# Patient Record
Sex: Female | Born: 1960 | Race: White | Hispanic: No | Marital: Married | Smoking: Never smoker
Health system: Southern US, Community
[De-identification: ages and names within clinical notes are randomized; demographics above are authoritative.]

## PROBLEM LIST (undated history)

## (undated) DIAGNOSIS — Z9889 Other specified postprocedural states: Secondary | ICD-10-CM

## (undated) DIAGNOSIS — K589 Irritable bowel syndrome without diarrhea: Secondary | ICD-10-CM

## (undated) DIAGNOSIS — K8020 Calculus of gallbladder without cholecystitis without obstruction: Secondary | ICD-10-CM

## (undated) DIAGNOSIS — R519 Headache, unspecified: Secondary | ICD-10-CM

## (undated) DIAGNOSIS — E785 Hyperlipidemia, unspecified: Secondary | ICD-10-CM

## (undated) DIAGNOSIS — I10 Essential (primary) hypertension: Secondary | ICD-10-CM

## (undated) DIAGNOSIS — R51 Headache: Secondary | ICD-10-CM

## (undated) DIAGNOSIS — R112 Nausea with vomiting, unspecified: Secondary | ICD-10-CM

## (undated) DIAGNOSIS — Z87442 Personal history of urinary calculi: Secondary | ICD-10-CM

## (undated) HISTORY — DX: Essential (primary) hypertension: I10

## (undated) HISTORY — PX: OTHER SURGICAL HISTORY: SHX169

## (undated) HISTORY — DX: Hyperlipidemia, unspecified: E78.5

---

## 1997-07-04 ENCOUNTER — Other Ambulatory Visit: Admission: RE | Admit: 1997-07-04 | Discharge: 1997-07-04 | Payer: Self-pay | Admitting: Obstetrics & Gynecology

## 1998-08-27 ENCOUNTER — Other Ambulatory Visit: Admission: RE | Admit: 1998-08-27 | Discharge: 1998-08-27 | Payer: Self-pay | Admitting: Obstetrics & Gynecology

## 1999-01-28 ENCOUNTER — Inpatient Hospital Stay (HOSPITAL_COMMUNITY): Admission: AD | Admit: 1999-01-28 | Discharge: 1999-01-28 | Payer: Self-pay | Admitting: Obstetrics and Gynecology

## 1999-05-02 ENCOUNTER — Inpatient Hospital Stay (HOSPITAL_COMMUNITY): Admission: AD | Admit: 1999-05-02 | Discharge: 1999-05-05 | Payer: Self-pay | Admitting: Obstetrics & Gynecology

## 2000-07-01 ENCOUNTER — Other Ambulatory Visit: Admission: RE | Admit: 2000-07-01 | Discharge: 2000-07-01 | Payer: Self-pay | Admitting: Obstetrics & Gynecology

## 2001-07-22 ENCOUNTER — Other Ambulatory Visit: Admission: RE | Admit: 2001-07-22 | Discharge: 2001-07-22 | Payer: Self-pay | Admitting: Obstetrics & Gynecology

## 2002-02-25 ENCOUNTER — Encounter: Payer: Self-pay | Admitting: Emergency Medicine

## 2002-02-25 ENCOUNTER — Emergency Department (HOSPITAL_COMMUNITY): Admission: AC | Admit: 2002-02-25 | Discharge: 2002-02-25 | Payer: Self-pay

## 2002-03-15 ENCOUNTER — Encounter: Admission: RE | Admit: 2002-03-15 | Discharge: 2002-03-15 | Payer: Self-pay | Admitting: Family Medicine

## 2002-03-15 ENCOUNTER — Encounter: Payer: Self-pay | Admitting: Family Medicine

## 2002-03-28 ENCOUNTER — Encounter: Payer: Self-pay | Admitting: Family Medicine

## 2002-03-28 ENCOUNTER — Encounter: Admission: RE | Admit: 2002-03-28 | Discharge: 2002-03-28 | Payer: Self-pay | Admitting: Family Medicine

## 2002-09-22 ENCOUNTER — Other Ambulatory Visit: Admission: RE | Admit: 2002-09-22 | Discharge: 2002-09-22 | Payer: Self-pay | Admitting: Obstetrics & Gynecology

## 2003-10-04 ENCOUNTER — Other Ambulatory Visit: Admission: RE | Admit: 2003-10-04 | Discharge: 2003-10-04 | Payer: Self-pay | Admitting: Obstetrics & Gynecology

## 2003-11-13 ENCOUNTER — Ambulatory Visit (HOSPITAL_COMMUNITY): Admission: RE | Admit: 2003-11-13 | Discharge: 2003-11-13 | Payer: Self-pay | Admitting: Family Medicine

## 2004-11-21 ENCOUNTER — Other Ambulatory Visit: Admission: RE | Admit: 2004-11-21 | Discharge: 2004-11-21 | Payer: Self-pay | Admitting: Obstetrics & Gynecology

## 2007-06-01 ENCOUNTER — Encounter: Admission: RE | Admit: 2007-06-01 | Discharge: 2007-06-01 | Payer: Self-pay | Admitting: Obstetrics & Gynecology

## 2010-08-02 NOTE — Op Note (Signed)
[facility]  Patient:    Cassandra Jacobson, Cassandra Jacobson                       MRN: [id number] Proc. Date: 05/02/99 Adm. Date:  [id number] Attending:  Minette Headland                           Operative Report  PREOPERATIVE DIAGNOSIS:           1. Intrauterine pregnancy at term.                                   2. Surgically scarred uterus.                                   3. Surgical absence of left ovary.  POSTOPERATIVE DIAGNOSIS:          1. Intrauterine pregnancy at term.                                   2. Surgically scarred uterus.                                   3. Surgical absence of left ovary.                                   4. Delivery of viable female infant, Apgars of                                      8/9, cord pH 7.30.  OPERATION:                        Repeat low transverse cervical cesarean section.  SURGEON:                          Freddy Finner, M.D.  ASSISTANT:                        Juluis Mire, M.D.  ESTIMATED INTRAOPERATIVE BLOOD LOSS:  600 cc.  ANESTHESIA:                       Spinal.  INTRAOPERATIVE COMPLICATIONS:     None.  DETAILS OF PRESENT ILLNESS:       According to the admission note.  DESCRIPTION OF PROCEDURE:         The patient was admitted on the morning of surgery, brought to the operating room where she was placed under adequate spinal anesthesia, placed in dorsal recumbent position.  Right hip was elevated 15 degrees.  The abdomen was prepped and draped in the usual fashion, and Foley catheter was placed using sterile technique.  A low abdominal transverse incision was made partially through the old scar but not completely because of the irregularity and the placement of the old scar.  The incision was carried sharply down to the fascia.  The fascia was entered sharply and extended to extend the skin incision.  Rectus sheath was developed superiorly and inferiorly with blunt and sharp  dissection.  Bleeding subcutaneous and subfascial sites were controlled with Bovie.  The rectus muscles were divided in the midline.  The bladder was found to be far advanced on the lower segment, and careful dissection was used to extend the peritoneal opening.  Bladder blade was placed.  The bladder was dissected carefully off the lower segment.  Transverse  incision was made in the lower segment and extended bluntly in the transverse direction.  A viable female infant was then delivered without difficulty Apgars are noted above.  Fluid was clear.  Placenta and other products of conception were debrided from the uterus after collecting routine venous and anterior cord blood. The edges of the incision were grasped with ______ forceps.  Incision was closed in a single layer with running interlocking with 0 Monocryl.  Interrupted figure-of-eight of 0 Monocryl was required for complete hemostasis.  Small bleeding sources were controlled with the Bovie.  Irrigation was carried out.  The uterus was palpably and visibly normal.  Right tube and ovary were normal.  Left tube as normal.  Left ovary was absent.   After irrigation and appropriate counts, the abdominal incision was closed in layers.  Peritoneum and rectus muscles were closed in a single layer with 0 Monocryl.  Fascia was closed with running 0 PDS.  Skin was closed with large skin staples and 1/4 inch Steri-Strips.  The patient tolerated the operative procedure well and was taken to recovery in good condition. DD:  05/02/99 TD:  05/02/99 Job: [id number]

## 2010-08-02 NOTE — H&P (Signed)
[facility] of [facility]  Patient:    Cassandra Jacobson, Cassandra Jacobson                       MRN: [id number] Adm. Date:  [id number] Attending:  Minette Headland                         History and Physical  ADMISSION DIAGNOSES:  Intrauterine pregnancy at term.  Surgically scarred uterus.  HISTORY:  The patient is a 50 year old married white female, gravida 3, para 1, who was delivered by cesarean section for CPD with her first delivery in 1998.  She has had an essentially uneventful pregnancy and is admitted now for repeat cesarean  delivery.  REVIEW OF SYSTEMS:  Negative.  PAST MEDICAL HISTORY:  Recorded in the prenatal summary and will not be repeated.  PHYSICAL EXAMINATION:  HEENT:  Grossly within normal limits.  VITAL SIGNS:  Blood pressure is 120/78.  CHEST:  Clear to auscultation.  HEART:  Normal sinus rhythm without murmurs, gallops or rubs.  ABDOMEN:  Gravid.  Estimated fetal size of 7-1/2 pounds.  EXTREMITIES:  +1 edema without cyanosis or clubbing.  CERVIX:  2 dilated, 50% effaced.  ASSESSMENT:  Intrauterine pregnancy at term, surgically scarred uterus, first cesarean for CPD.  PLAN:  Repeat low transverse cesarean section. DD:  05/02/99 TD:  05/02/99 Job: [id number]

## 2010-08-02 NOTE — Discharge Summary (Signed)
[facility]  Patient:    Cassandra Jacobson, Cassandra Jacobson                       MRN: [id number] Adm. Date:  [id number] Disc. Date: [id number] Attending:  Minette Headland Dictator:   Danie Chandler, R.N.                           Discharge Summary  ADMISSION DIAGNOSES:          1. Intrauterine pregnancy at term.                               2. Surgically scarred uterus.                               3. Surgical absence of left ovary.  DISCHARGE DIAGNOSES:          1. Intrauterine pregnancy at term.                               2. Surgically scarred uterus.                               3. Surgical absence of left ovary.  PROCEDURE:                    On May 02, 1999, repeat low transverse cesarean section.  REASON FOR ADMISSION:         The patient is a 50 year old married white female, gravida 3, para 1, who was delivered by cesarean section for CPD with first delivery in 1998.  The patient has had an uneventful pregnancy and is now admitted for repeat cesarean delivery.  HOSPITAL COURSE:              The patient is taken to the operating room and undergoes the above named procedure without complications.  This is productive f a viable female infant with Apgars of 8 at one minute and 9 at five minutes and an arterial cord pH of 7.30.  Postoperatively, the patient does well.  On postoperative day #1, the patient had good control of pain.  Her hemoglobin was  10.3, hematocrit 30.1, and white blood cell count 8.5.  On postoperative day #2, the patient was tolerating a regular diet, had a good return of bowel function.  She also was ambulating well without difficulty.  She was discharged home on postoperative day #3.  CONDITION ON DISCHARGE:       Good.  DIET:                         Regular as tolerated.  ACTIVITY:                     No heavy lifting, no driving, and no vaginal entry.  FOLLOW-UP:                    She is to follow up in the  office in one to two weeks for incision check.  She is to call for temperature greater than 100 degrees, persistent nausea and vomiting, heavy vaginal bleeding, and/or redness or drainage from  the incision site.  DISCHARGE MEDICATIONS:        1. Prenatal vitamins one p.o. q.d.                               2. Tylox #40 one to two p.o. q.4h. p.r.n. pain.                               3. Motrin 600 mg #30 one every six hours as needed                                  for pain. DD:  05/15/99 TD:  05/15/99 Job: [id number]

## 2013-09-12 ENCOUNTER — Encounter: Payer: Self-pay | Admitting: Cardiovascular Disease

## 2013-09-12 ENCOUNTER — Ambulatory Visit (INDEPENDENT_AMBULATORY_CARE_PROVIDER_SITE_OTHER): Payer: BC Managed Care – PPO | Admitting: Cardiovascular Disease

## 2013-09-12 VITALS — BP 152/110 | HR 86 | Ht 62.0 in | Wt 153.5 lb

## 2013-09-12 DIAGNOSIS — E785 Hyperlipidemia, unspecified: Secondary | ICD-10-CM

## 2013-09-12 DIAGNOSIS — I10 Essential (primary) hypertension: Secondary | ICD-10-CM | POA: Insufficient documentation

## 2013-09-12 DIAGNOSIS — Z79899 Other long term (current) drug therapy: Secondary | ICD-10-CM

## 2013-09-12 MED ORDER — LISINOPRIL 5 MG PO TABS: 5.0000 mg | ORAL_TABLET | Freq: Every day | ORAL | Status: DC

## 2013-09-12 NOTE — Assessment & Plan Note (Signed)
Apparently her LDL is elevated at recent blood blood work performed by her primary care physician. I will obtain a copy and review and make further recommendations

## 2013-09-12 NOTE — Patient Instructions (Signed)
  We will see you back in follow up in 1 year with Dr Allyson SabalBerry.   We will see you in 1 month with our pharmacist, Belenda CruiseKristin, for blood pressure management.   Dr Allyson SabalBerry has ordered : 1. Start Lisinopril 5 mg daily  2. Have blood work done in 2 weeks.

## 2013-09-12 NOTE — Assessment & Plan Note (Signed)
Her blood pressure have been running in the 140-150 range over the mid 90s. She does not watch her salt intake. She's under a lot of stress at home because of personal and family issues. I'm going to begin her on lisinopril 5 mg a day. We'll check electrolytes and renal function 2 weeks. She'll see Belenda CruiseKristin back in the office for blood pressure followup.

## 2013-09-12 NOTE — Progress Notes (Signed)
09/12/2013 Cassandra Jacobson   28-Feb-1961  [id number]  Primary Physician Herb GraysSPEAR, TAMMY, MD Primary Cardiologist: Runell GessJonathan J. Margia Wiesen MD Roseanne RenoFACP,FACC,FAHA, FSCAI   HPI:  Ms. 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 She was referred by Dr. Marcos EkeJimmy Smith for cardiovascular evaluation because of positive effectors and hypertension. She was seen here 6 years ago by Dr. Charlett BlakeEischorn  for an abnormal EKG consistent with mild left ventricular hypertrophy. A 2-D echo was performed at that time which was entirely normal specifically, there is no evidence of ventricular hypertrophy. She has noted progressive factors other than mild elevation of her LDL by her primary care physician. She is under a lot of stress at home with family related issues and has been following her blood pressure at home which has been moderately elevated.  Current Outpatient Prescriptions  Medication Sig Dispense Refill  . DULoxetine (CYMBALTA) 30 MG capsule Take 30 mg by mouth daily.      . fluticasone (VERAMYST) 27.5 MCG/SPRAY nasal spray Place 2 sprays into the nose as needed for rhinitis.      Marland Kitchen. LORazepam (ATIVAN) 1 MG tablet Take 1 mg by mouth as needed for anxiety.      . norgestimate-ethinyl estradiol (ORTHO-CYCLEN,SPRINTEC,PREVIFEM) 0.25-35 MG-MCG tablet Take 1 tablet by mouth daily.       No current facility-administered medications for this visit.    No Known Allergies  History   Social History  . Marital Status: Married    Spouse Name: N/A    Number of Children: N/A  . Years of Education: N/A   Occupational History  . Not on file.   Social History Main Topics  . Smoking status: Never Smoker   . Smokeless tobacco: Not on file  . Alcohol Use: Not on file  . Drug Use: Not on file  . Sexual Activity: Not on file   Other Topics Concern  . Not on file   Social History Narrative  . No  narrative on file     Review of Systems: General: negative for chills, fever, night sweats or weight changes.  Cardiovascular: negative for chest pain, dyspnea on exertion, edema, orthopnea, palpitations, paroxysmal nocturnal dyspnea or shortness of breath Dermatological: negative for rash Respiratory: negative for cough or wheezing Urologic: negative for hematuria Abdominal: negative for nausea, vomiting, diarrhea, bright red blood per rectum, melena, or hematemesis Neurologic: negative for visual changes, syncope, or dizziness All other systems reviewed and are otherwise negative except as noted above.    Blood pressure 152/110, pulse 86, height 5\' 2"  (1.575 m), weight 153 lb 8 oz (69.627 kg).  General appearance: alert and no distress Neck: no adenopathy, no carotid bruit, no JVD, supple, symmetrical, trachea midline and thyroid not enlarged, symmetric, no tenderness/mass/nodules Lungs: clear to auscultation bilaterally Heart: regular rate and rhythm, S1, S2 normal, no murmur, click, rub or gallop Extremities: extremities normal, atraumatic, no cyanosis or edema  EKG normal sinus rhythm at 86 without ST or T wave changes  ASSESSMENT AND PLAN:   Essential hypertension Her blood pressure have been running in the 140-150 range over the mid 90s. She does not watch her salt intake. She's under a lot of stress at home because of personal and family issues. I'm going to begin her on lisinopril 5 mg a day. We'll check electrolytes and renal function 2 weeks. She'll see Belenda CruiseKristin back in the office for  blood pressure followup.  Hyperlipidemia Apparently her LDL is elevated at recent blood blood work performed by her primary care physician. I will obtain a copy and review and make further recommendations      Runell GessJonathan J. Marzetta Lanza MD [facility] Specialty HospitalFACP,FACC,FAHA, [facility]FSCAI 09/12/2013 4:36 PM

## 2013-09-28 LAB — BASIC METABOLIC PANEL
BUN: 10 mg/dL (ref 6–23)
CHLORIDE: 104 meq/L (ref 96–112)
CO2: 26 mEq/L (ref 19–32)
CREATININE: 0.81 mg/dL (ref 0.50–1.10)
Calcium: 8.9 mg/dL (ref 8.4–10.5)
Glucose, Bld: 122 mg/dL — ABNORMAL HIGH (ref 70–99)
Potassium: 4.7 mEq/L (ref 3.5–5.3)
Sodium: 139 mEq/L (ref 135–145)

## 2013-09-29 ENCOUNTER — Encounter: Payer: Self-pay | Admitting: *Deleted

## 2013-10-12 ENCOUNTER — Ambulatory Visit (INDEPENDENT_AMBULATORY_CARE_PROVIDER_SITE_OTHER): Payer: BC Managed Care – PPO | Admitting: Pharmacist Clinician (PhC)/ Clinical Pharmacy Specialist

## 2013-10-12 ENCOUNTER — Encounter: Payer: Self-pay | Admitting: Pharmacist Clinician (PhC)/ Clinical Pharmacy Specialist

## 2013-10-12 VITALS — BP 134/90 | HR 76 | Ht 62.0 in | Wt 152.2 lb

## 2013-10-12 DIAGNOSIS — I10 Essential (primary) hypertension: Secondary | ICD-10-CM

## 2013-10-12 NOTE — Patient Instructions (Signed)
Your BP today is 134/90  (goal <140/90)  Check your blood pressure at home 3-4 times per week (if able) and keep record of the readings.  Take your BP meds as follows: continue with lisinopril 5mg  daily  Bring all of your meds, your BP cuff and your record of home blood pressures to your next appointment.  Exercise as you're able, try to walk approximately 30 minutes per day.  Keep salt intake to a minimum, especially watch canned and prepared boxed foods.  Eat more fresh fruits and vegetables and fewer canned items.  Avoid eating in fast food restaurants.    HOW TO TAKE YOUR BLOOD PRESSURE:   Rest 5 minutes before taking your blood pressure.    Don't smoke or drink caffeinated beverages for at least 30 minutes before.   Take your blood pressure before (not after) you eat.   Sit comfortably with your back supported and both feet on the floor (don't cross your legs).   Elevate your arm to heart level on a table or a desk.   Use the proper sized cuff. It should fit smoothly and snugly around your bare upper arm. There should be enough room to slip a fingertip under the cuff. The bottom edge of the cuff should be 1 inch above the crease of the elbow.   Ideally, take 3 measurements at one sitting and record the average.

## 2013-10-12 NOTE — Progress Notes (Signed)
     10/12/2013 Cassandra Jacobson 11/10/1960 [id number]   HPI:  Cassandra Jacobson is a 53 y.o. female patient of Dr Allyson SabalBerry, with a PMH below who presents today for a blood pressure check.  Cassandra Jacobson saw Dr. Allyson SabalBerry last month due to elevated blood pressure and was started on lisinopril 5mg .  She has had no problems with this, and states her home BP readings started to drop within days of starting the medication.  She did not bring her cuff with her today, but states most home readings are 120-130/70-90.  She is a little concerned about her heart rate, usually in the 80s.   She has had some stressful family issues in the past 10-12 months, including closing the estates of both her father and grandmother, as well as tending to her aging mother.  She has 2 teenage daughters and continues to work full time.  She does not use any tobacco products or drink alcohol.  She usually has just one cup of coffee at work each day.  She is careful to avoid high sodium foods and does not cook with salt or add extra at the table.  She hasn't done any regular exercise in the past year with everything else going on in her life.  Her family history is significant in that 3 of her grandparents had heart disease, as does her mother.  She is an only child.   Current Outpatient Prescriptions  Medication Sig Dispense Refill  . DULoxetine (CYMBALTA) 30 MG capsule Take 30 mg by mouth daily.      . fluticasone (VERAMYST) 27.5 MCG/SPRAY nasal spray Place 2 sprays into the nose as needed for rhinitis.      Marland Kitchen. lisinopril (PRINIVIL,ZESTRIL) 5 MG tablet Take 1 tablet (5 mg total) by mouth daily.  30 tablet  11  . norgestimate-ethinyl estradiol (ORTHO-CYCLEN,SPRINTEC,PREVIFEM) 0.25-35 MG-MCG tablet Take 1 tablet by mouth daily.       No current facility-administered medications for this visit.    No Known Allergies  Past Medical History  Diagnosis Date  . Hypertension   . Hyperlipidemia     Blood pressure 134/90, pulse 76, height 5'  2" (1.575 m), weight 152 lb 3.2 oz (69.037 kg). Standing 122/90  R arm 128/86   ASSESSMENT AND PLAN:  Today her BP is well controlled at 134/90.  She is not having any problems with her lisinopril.  She took her home BP cuff to her family MD several months ago and it was found to be accurate.  Of note she is using oral contraceptives to help with pre-menopausal symptoms, but states her gynecologist is planning to wean her off of them before the year is over.  We discussed the fact that estrogen can increase BP, so she knows to watch for any further decreases in her BP after the contraceptives are discontinued.  For now I have asked her to continue the lisinopril each day and also continue with home BP checks several times per week.  If she notices the readings increasing above her goal (<140/90), she can call the office for instructions.    Cassandra Jacobson PharmD CPP [facility]

## 2014-01-12 ENCOUNTER — Encounter (HOSPITAL_BASED_OUTPATIENT_CLINIC_OR_DEPARTMENT_OTHER): Payer: Self-pay | Admitting: Emergency Medicine

## 2014-01-12 ENCOUNTER — Emergency Department (HOSPITAL_BASED_OUTPATIENT_CLINIC_OR_DEPARTMENT_OTHER)
Admission: EM | Admit: 2014-01-12 | Discharge: 2014-01-12 | Disposition: A | Discharge status: Home / Self Care | Payer: BC Managed Care – PPO | Attending: Emergency Medicine | Admitting: Emergency Medicine

## 2014-01-12 ENCOUNTER — Emergency Department (HOSPITAL_BASED_OUTPATIENT_CLINIC_OR_DEPARTMENT_OTHER): Payer: BC Managed Care – PPO

## 2014-01-12 DIAGNOSIS — R1013 Epigastric pain: Secondary | ICD-10-CM | POA: Diagnosis present

## 2014-01-12 DIAGNOSIS — I10 Essential (primary) hypertension: Secondary | ICD-10-CM | POA: Insufficient documentation

## 2014-01-12 DIAGNOSIS — K8000 Calculus of gallbladder with acute cholecystitis without obstruction: Secondary | ICD-10-CM | POA: Diagnosis not present

## 2014-01-12 DIAGNOSIS — Z79899 Other long term (current) drug therapy: Secondary | ICD-10-CM | POA: Diagnosis not present

## 2014-01-12 DIAGNOSIS — Z8639 Personal history of other endocrine, nutritional and metabolic disease: Secondary | ICD-10-CM | POA: Diagnosis not present

## 2014-01-12 HISTORY — DX: Irritable bowel syndrome, unspecified: K58.9

## 2014-01-12 LAB — COMPREHENSIVE METABOLIC PANEL
ALK PHOS: 82 U/L (ref 39–117)
ALT: 7 U/L (ref 0–35)
AST: 14 U/L (ref 0–37)
Albumin: 3.7 g/dL (ref 3.5–5.2)
Anion gap: 16 — ABNORMAL HIGH (ref 5–15)
BUN: 9 mg/dL (ref 6–23)
CHLORIDE: 99 meq/L (ref 96–112)
CO2: 24 meq/L (ref 19–32)
Calcium: 9.7 mg/dL (ref 8.4–10.5)
Creatinine, Ser: 0.8 mg/dL (ref 0.50–1.10)
GFR, EST NON AFRICAN AMERICAN: 83 mL/min — AB (ref >90)
GLUCOSE: 109 mg/dL — AB (ref 70–99)
POTASSIUM: 4.1 meq/L (ref 3.7–5.3)
SODIUM: 139 meq/L (ref 137–147)
Total Bilirubin: 0.7 mg/dL (ref 0.3–1.2)
Total Protein: 7.1 g/dL (ref 6.0–8.3)

## 2014-01-12 LAB — URINALYSIS, ROUTINE W REFLEX MICROSCOPIC
Glucose, UA: NEGATIVE mg/dL
Ketones, ur: 15 mg/dL — AB
NITRITE: NEGATIVE
Protein, ur: 100 mg/dL — AB
SPECIFIC GRAVITY, URINE: 1.027 (ref 1.005–1.030)
UROBILINOGEN UA: 1 mg/dL (ref 0.0–1.0)
pH: 5 (ref 5.0–8.0)

## 2014-01-12 LAB — URINE MICROSCOPIC-ADD ON

## 2014-01-12 LAB — LIPASE, BLOOD: Lipase: 15 U/L (ref 11–59)

## 2014-01-12 LAB — CBC WITH DIFFERENTIAL/PLATELET
BASOS ABS: 0 10*3/uL (ref 0.0–0.1)
Basophils Relative: 0 % (ref 0–1)
Eosinophils Absolute: 0.1 10*3/uL (ref 0.0–0.7)
Eosinophils Relative: 1 % (ref 0–5)
HCT: 40.5 % (ref 36.0–46.0)
Hemoglobin: 14.2 g/dL (ref 12.0–15.0)
LYMPHS ABS: 2.3 10*3/uL (ref 0.7–4.0)
LYMPHS PCT: 24 % (ref 12–46)
MCH: 30.9 pg (ref 26.0–34.0)
MCHC: 35.1 g/dL (ref 30.0–36.0)
MCV: 88.2 fL (ref 78.0–100.0)
Monocytes Absolute: 0.6 10*3/uL (ref 0.1–1.0)
Monocytes Relative: 6 % (ref 3–12)
NEUTROS ABS: 6.6 10*3/uL (ref 1.7–7.7)
NEUTROS PCT: 69 % (ref 43–77)
PLATELETS: 291 10*3/uL (ref 150–400)
RBC: 4.59 MIL/uL (ref 3.87–5.11)
RDW: 12.6 % (ref 11.5–15.5)
WBC: 9.6 10*3/uL (ref 4.0–10.5)

## 2014-01-12 MED ORDER — HYDROCODONE-ACETAMINOPHEN 5-325 MG PO TABS: 1.0000 | ORAL_TABLET | Freq: Four times a day (QID) | ORAL | Status: DC | PRN

## 2014-01-12 NOTE — ED Notes (Signed)
Pt reports nausea, no vomiting. Denies fever.  Has been bloated, belching, and passed a hard stool last night.  Has ongoing personal stressors that seem worse recently.

## 2014-01-12 NOTE — ED Notes (Signed)
Patient transported to Ultrasound 

## 2014-01-12 NOTE — Discharge Instructions (Signed)
Hydrocodone as prescribed as needed for pain.  Follow-up with [facility] to discuss your gallstones. Return to the emergency department if you develop worsening pain, high fever, vomiting, or other new and concerning symptoms.   Cholelithiasis Cholelithiasis (also called gallstones) is a form of gallbladder disease in which gallstones form in your gallbladder. The gallbladder is an organ that stores bile made in the liver, which helps digest fats. Gallstones begin as small crystals and slowly grow into stones. Gallstone pain occurs when the gallbladder spasms and a gallstone is blocking the duct. Pain can also occur when a stone passes out of the duct.  RISK FACTORS  Being female.   Having multiple pregnancies. Health care providers sometimes advise removing diseased gallbladders before future pregnancies.   Being obese.  Eating a diet heavy in fried foods and fat.   Being older than 60 years and increasing age.   Prolonged use of medicines containing female hormones.   Having diabetes mellitus.   Rapidly losing weight.   Having a family history of gallstones (heredity).  SYMPTOMS  Nausea.   Vomiting.  Abdominal pain.   Yellowing of the skin (jaundice).   Sudden pain. It may persist from several minutes to several hours.  Fever.   Tenderness to the touch. In some cases, when gallstones do not move into the bile duct, people have no pain or symptoms. These are called "silent" gallstones.  TREATMENT Silent gallstones do not need treatment. In severe cases, emergency surgery may be required. Options for treatment include:  Surgery to remove the gallbladder. This is the most common treatment.  Medicines. These do not always work and may take 6-12 months or more to work.  Shock wave treatment (extracorporeal biliary lithotripsy). In this treatment an ultrasound machine sends shock waves to the gallbladder to break gallstones into smaller pieces  that can pass into the intestines or be dissolved by medicine. HOME CARE INSTRUCTIONS   Only take over-the-counter or prescription medicines for pain, discomfort, or fever as directed by your health care provider.   Follow a low-fat diet until seen again by your health care provider. Fat causes the gallbladder to contract, which can result in pain.   Follow up with your health care provider as directed. Attacks are almost always recurrent and surgery is usually required for permanent treatment.  SEEK IMMEDIATE MEDICAL CARE IF:   Your pain increases and is not controlled by medicines.   You have a fever or persistent symptoms for more than 2-3 days.   You have a fever and your symptoms suddenly get worse.   You have persistent nausea and vomiting.  MAKE SURE YOU:   Understand these instructions.  Will watch your condition.  Will get help right away if you are not doing well or get worse. Document Released: 02/27/2005 Document Revised: 11/03/2012 Document Reviewed: 08/25/2012 [facility] Patient Information 2015 [place], [place]. This information is not intended to replace advice given to you by your health care provider. Make sure you discuss any questions you have with your health care provider.

## 2014-01-12 NOTE — ED Notes (Signed)
MD at bedside. 

## 2014-01-12 NOTE — ED Provider Notes (Signed)
CSN: [id number]     Arrival date & time 01/12/14  1122 History   First MD Initiated Contact with Patient 01/12/14 1223     Chief Complaint  Patient presents with  . Abdominal Pain     (Consider location/radiation/quality/duration/timing/severity/associated sxs/prior Treatment) HPI Comments: Patient is a 53 year old female with history of hypertension and high cholesterol. She presents with complaints of epigastric discomfort for the past 3 months. This occurs intermittently and seems to be the most severe at night. It is not related to food. She denies any fevers or chills. She does report a significant amount of stress in her life over the past year. Her symptoms are somewhat improved with antacids.  Patient is a 53 y.o. female presenting with abdominal pain. The history is provided by the patient.  Abdominal Pain Pain location:  Epigastric Pain quality: burning and cramping   Pain radiates to:  Does not radiate Pain severity:  Moderate Duration: 3 months. Timing:  Intermittent Progression:  Worsening Chronicity:  New Relieved by:  Nothing Worsened by:  Nothing tried Ineffective treatments:  None tried   Past Medical History  Diagnosis Date  . Hypertension   . Hyperlipidemia   . IBS (irritable bowel syndrome)    Past Surgical History  Procedure Laterality Date  . Cesarean section     No family history on file. History  Substance Use Topics  . Smoking status: Never Smoker   . Smokeless tobacco: Not on file  . Alcohol Use: No   OB History   Grav Para Term Preterm Abortions TAB SAB Ect Mult Living                 Review of Systems  Gastrointestinal: Positive for abdominal pain.  All other systems reviewed and are negative.     Allergies  Review of patient's allergies indicates no known allergies.  Home Medications   Prior to Admission medications   Medication Sig Start Date End Date Taking? Authorizing Provider  DULoxetine (CYMBALTA) 30 MG capsule Take 30  mg by mouth daily.    Historical Provider, MD  fluticasone (VERAMYST) 27.5 MCG/SPRAY nasal spray Place 2 sprays into the nose as needed for rhinitis.    Historical Provider, MD  lisinopril (PRINIVIL,ZESTRIL) 5 MG tablet Take 1 tablet (5 mg total) by mouth daily. 09/12/13   Runell GessJonathan J Berry, MD  norgestimate-ethinyl estradiol (ORTHO-CYCLEN,SPRINTEC,PREVIFEM) 0.25-35 MG-MCG tablet Take 1 tablet by mouth daily.    Historical Provider, MD   BP 137/66  Pulse 106  Temp(Src) 98.6 F (37 C) (Oral)  Resp 16  Ht 5\' 2"  (1.575 m)  Wt 149 lb (67.586 kg)  BMI 27.25 kg/m2  SpO2 100%  LMP 01/12/2014 Physical Exam  Nursing note and vitals reviewed. Constitutional: She is oriented to person, place, and time. She appears well-developed and well-nourished. No distress.  HENT:  Head: Normocephalic and atraumatic.  Neck: Normal range of motion. Neck supple.  Cardiovascular: Normal rate and regular rhythm.  Exam reveals no gallop and no friction rub.   No murmur heard. Pulmonary/Chest: Effort normal and breath sounds normal. No respiratory distress. She has no wheezes.  Abdominal: Soft. Bowel sounds are normal. She exhibits no distension. There is tenderness.  There is mild tenderness to palpation in the epigastric region. There is no rebound or guarding.  Musculoskeletal: Normal range of motion.  Neurological: She is alert and oriented to person, place, and time.  Skin: Skin is warm and dry. She is not diaphoretic.    ED Course  Procedures (including critical care time) Labs Review Labs Reviewed  URINALYSIS, ROUTINE W REFLEX MICROSCOPIC - Abnormal; Notable for the following:    Color, Urine RED (*)    Hgb urine dipstick LARGE (*)    Bilirubin Urine SMALL (*)    Ketones, ur 15 (*)    Protein, ur 100 (*)    Leukocytes, UA SMALL (*)    All other components within normal limits  COMPREHENSIVE METABOLIC PANEL - Abnormal; Notable for the following:    Glucose, Bld 109 (*)    GFR calc non Af Amer 83  (*)    Anion gap 16 (*)    All other components within normal limits  URINE MICROSCOPIC-ADD ON - Abnormal; Notable for the following:    Bacteria, UA FEW (*)    All other components within normal limits  CBC WITH DIFFERENTIAL  LIPASE, BLOOD    Imaging Review No results found.   EKG Interpretation None      MDM   Final diagnoses:  Epigastric pain    Patient presents with complaints of right upper quadrant and epigastric pain that has been occurring episodically for many months. His been worse over the past 3 days. Today's workup reveals no elevation of white count, normal LFTs, normal lipase. She did go for an ultrasound which reveals cholelithiasis with a mildly edematous gallbladder wall concerning for cholecystitis. There is no evidence for ductal dilatation in the common bile duct or elsewhere.  While in the emergency department, her symptoms spontaneously resolved completely and she is experiencing no discomfort. While waiting for Dr. Abbey Chattersosenbower to call back, the patient informed me that she has kids to pick up at school. She appears clinically well, has normal labs, and is now pain-free and I feel it is appropriate to discharge her with pain medication should her symptoms return and follow-up with general surgery. She'll be given the follow-up information for [place] [facility] and understands to return if she develops fever, worsening pain, or other new and concerning symptoms.    Geoffery Lyonsouglas Darly Fails, MD 01/12/14 [phone]

## 2014-01-12 NOTE — ED Notes (Signed)
3 month hx intermittent epigastric pain that awakens her during the night and has occurred 3-4 times.  The past few days symptoms have increased and taking Antacids with some relief.  Took Zantac that seemed to help more than anything.  Awakened at 0300 this morning with severe back pain radiating to mid back.  Pain is described as stabbing.

## 2014-01-20 NOTE — Progress Notes (Signed)
Please put orders in Epic surgery 01-31-14 pre op 01-26-14 Thanks 

## 2014-01-23 ENCOUNTER — Ambulatory Visit (INDEPENDENT_AMBULATORY_CARE_PROVIDER_SITE_OTHER): Payer: Self-pay | Admitting: Surgery

## 2014-01-23 ENCOUNTER — Encounter (HOSPITAL_COMMUNITY): Payer: Self-pay | Admitting: Surgery

## 2014-01-23 DIAGNOSIS — K8010 Calculus of gallbladder with chronic cholecystitis without obstruction: Secondary | ICD-10-CM | POA: Diagnosis present

## 2014-01-23 NOTE — H&P (Signed)
General Surgery [facility]  Cassandra Jacobson 01/16/2014 1:53 PM Location: [facility] Patient #: [id number] DOB: [birth year] Married / Language: Undefined / Race: Undefined Female  History of Present Illness Velora Heckler(Etoile Looman M. Madeliene Tejera MD; 01/16/2014 2:19 PM) The patient is a 53 year old female who presents for evaluation of gallbladder disease. Patient is referred by Dr. Herb Graysammy Spear for evaluation of symptomatic cholelithiasis.  Patient presents with a two-month history of intermittent nighttime he indigestion. This past week, following a fatty late dinner, she experienced severe epigastric abdominal pain radiating to the back. She notes bloating but no nausea or emesis. Patient presented to the emergency department where ultrasound demonstrated multiple gallstones. Also noted was a 2.8 cm complex cyst in the medial aspect of the left lobe of the liver. Gallbladder wall was moderately thickened and edematous. Laboratory studies were within normal limits. Patient is now referred for consideration for cholecystectomy for symptomatic cholelithiasis and chronic cholecystitis.  There is a family history of cholecystectomy in the patient's father. Patient denies any history of jaundice or acholic stools. She denies fevers or chills. She has had symptoms of irritable bowel syndrome in the past. Previous abdominal surgery includes cesarean section and left oophorectomy.    Other Problems Iona Hansen(Cindy Smithey, LPN; 81/1/914711/04/2013 8:291:53 PM) Anxiety Disorder Back Pain Bladder Problems Cholelithiasis High blood pressure Kidney Stone Migraine Headache Oophorectomy  Past Surgical History Iona Hansen(Cindy Smithey, LPN; 56/2/130811/04/2013 6:571:53 PM) Cesarean Section - Multiple Oral Surgery  Diagnostic Studies History Iona Hansen(Cindy Smithey, LPN; 84/6/962911/04/2013 5:281:53 PM) Colonoscopy never Mammogram within last year Pap Smear 1-5 years ago  Allergies Iona Hansen(Cindy Smithey, LPN; 41/3/244011/04/2013 1:021:54 PM) No Known Drug  Allergies11/04/2013  Medication History Iona Hansen(Cindy Smithey, LPN; 72/5/366411/04/2013 4:031:54 PM) Hydrocodone-Acetaminophen (Oral) Specific dose unknown - Active. DULoxetine HCl (Oral) Specific dose unknown - Active. Lisinopril (Oral) Specific dose unknown - Active. Sprintec 28 (Oral) Specific dose unknown - Active.  Social History Arline Asp(Cindy DublinSmithey, LPN; 47/4/259511/04/2013 6:381:53 PM) Alcohol use Occasional alcohol use. Caffeine use Coffee, Tea. No drug use Tobacco use Never smoker.  Family History Iona Hansen(Cindy Smithey, LPN; 75/6/433211/04/2013 9:511:53 PM) Alcohol Abuse Father. Breast Cancer Mother. Cancer Family Members In General. Colon Polyps Mother. Depression Father. Heart Disease Mother. Hypertension Mother. Melanoma Father. Migraine Headache Daughter. Ovarian Cancer Family Members In General. Thyroid problems Mother.  Pregnancy / Birth History Iona Hansen(Cindy Smithey, LPN; 88/4/166011/04/2013 6:301:53 PM) Age at menarche 14 years. Contraceptive History Oral contraceptives. Gravida 3 Maternal age 52-35 Para 2 Regular periods  Review of Systems Arline Asp(Cindy Smithey LPN; 16/0/109311/04/2013 2:351:53 PM) General Not Present- Appetite Loss, Chills, Fatigue, Fever, Night Sweats, Weight Gain and Weight Loss. Skin Not Present- Change in Wart/Mole, Dryness, Hives, Jaundice, New Lesions, Non-Healing Wounds, Rash and Ulcer. HEENT Present- Seasonal Allergies and Wears glasses/contact lenses. Not Present- Earache, Hearing Loss, Hoarseness, Nose Bleed, Oral Ulcers, Ringing in the Ears, Sinus Pain, Sore Throat, Visual Disturbances and Yellow Eyes. Respiratory Not Present- Bloody sputum, Chronic Cough, Difficulty Breathing, Snoring and Wheezing. Breast Not Present- Breast Mass, Breast Pain, Nipple Discharge and Skin Changes. Cardiovascular Not Present- Chest Pain, Difficulty Breathing Lying Down, Leg Cramps, Palpitations, Rapid Heart Rate, Shortness of Breath and Swelling of Extremities. Gastrointestinal Present- Abdominal Pain, Bloating and  Constipation. Not Present- Bloody Stool, Change in Bowel Habits, Chronic diarrhea, Difficulty Swallowing, Excessive gas, Gets full quickly at meals, Hemorrhoids, Indigestion, Nausea, Rectal Pain and Vomiting. Female Genitourinary Not Present- Frequency, Nocturia, Painful Urination, Pelvic Pain and Urgency. Musculoskeletal Not Present- Back Pain, Joint Pain, Joint Stiffness, Muscle Pain, Muscle  Weakness and Swelling of Extremities. Neurological Not Present- Decreased Memory, Fainting, Headaches, Numbness, Seizures, Tingling, Tremor, Trouble walking and Weakness. Psychiatric Not Present- Anxiety, Bipolar, Change in Sleep Pattern, Depression, Fearful and Frequent crying. Endocrine Present- Hot flashes. Not Present- Cold Intolerance, Excessive Hunger, Hair Changes, Heat Intolerance and New Diabetes. Hematology Not Present- Easy Bruising, Excessive bleeding, Gland problems, HIV and Persistent Infections.   Vitals Arline Asp(Cindy Smithey LPN; 16/1/096011/04/2013 4:541:55 PM) 01/16/2014 1:54 PM Weight: 149 lb Height: 62in Body Surface Area: 1.72 m Body Mass Index: 27.25 kg/m Temp.: 71F(Oral)  Pulse: 76 (Regular)  Resp.: 18 (Unlabored)  BP: 140/86 (Sitting, Left Arm, Standard)    Physical Exam Velora Heckler(Rolinda Impson M. Raevin Wierenga MD; 01/16/2014 2:19 PM) The physical exam findings are as follows: Note:General - appears comfortable, no distress; not diaphorectic  HEENT - normocephalic; sclerae clear, gaze conjugate; mucous membranes moist, dentition good; voice normal  Neck - symmetric on extension; no palpable anterior or posterior cervical adenopathy; no palpable masses in the thyroid bed  Chest - clear bilaterally with rhonchi, rales, or wheeze  Cor - regular rhythm with normal rate; no significant murmur  Abd - soft without distension; no mass; no tenderness; no HSM  Ext - non-tender without significant edema or lymphedema  Neuro - grossly intact; no tremor    Assessment & Plan Velora Heckler(Prescilla Monger M. Gita Dilger MD; 01/16/2014  2:21 PM) CHOLELITHIASIS WITH CHOLECYSTITIS (574.10  K80.10) Current Plans  Instructed to keep follow-up appointment as scheduled  The patient I reviewed her ultrasound report including the cystic lesion in the liver. We reviewed her laboratory studies. I provided her with written literature on laparoscopic cholecystectomy to review at home.  Patient has symptomatic cholelithiasis and probable chronic cholecystitis. There is likely a small benign cyst in the left lobe of the liver. I have recommended proceeding with laparoscopic cholecystectomy with intraoperative cholangiography. We will also attempt to visualize the cystic lesion in the liver at the time of surgery. Risk and benefits of the procedure discussed including the possibility of conversion to open surgery. We have discussed the hospital stay and postoperative recovery. Patient would like to proceed in the near future.  The risks and benefits of the procedure have been discussed at length with the patient. The patient understands the proposed procedure, potential alternative treatments, and the course of recovery to be expected. All of the patient's questions have been answered at this time. The patient wishes to proceed with surgery.  Velora Hecklerodd M. Jalesia Loudenslager, MD, [facility] Office: [phone]

## 2014-01-26 ENCOUNTER — Encounter (HOSPITAL_COMMUNITY)
Admission: RE | Admit: 2014-01-26 | Discharge: 2014-01-26 | Disposition: A | Discharge status: Home / Self Care | Payer: BC Managed Care – PPO | Source: Ambulatory Visit | Attending: Surgery | Admitting: Surgery

## 2014-01-26 ENCOUNTER — Ambulatory Visit (HOSPITAL_COMMUNITY)
Admission: RE | Admit: 2014-01-26 | Discharge: 2014-01-26 | Disposition: A | Discharge status: Home / Self Care | Payer: BC Managed Care – PPO | Source: Ambulatory Visit | Attending: Anesthesiology | Admitting: Anesthesiology

## 2014-01-26 ENCOUNTER — Encounter (HOSPITAL_COMMUNITY): Payer: Self-pay

## 2014-01-26 DIAGNOSIS — Z01818 Encounter for other preprocedural examination: Secondary | ICD-10-CM | POA: Diagnosis present

## 2014-01-26 DIAGNOSIS — I10 Essential (primary) hypertension: Secondary | ICD-10-CM

## 2014-01-26 DIAGNOSIS — K8010 Calculus of gallbladder with chronic cholecystitis without obstruction: Secondary | ICD-10-CM | POA: Insufficient documentation

## 2014-01-26 HISTORY — DX: Headache, unspecified: R51.9

## 2014-01-26 HISTORY — DX: Personal history of urinary calculi: Z87.442

## 2014-01-26 HISTORY — DX: Headache: R51

## 2014-01-26 HISTORY — DX: Other specified postprocedural states: R11.2

## 2014-01-26 HISTORY — DX: Calculus of gallbladder without cholecystitis without obstruction: K80.20

## 2014-01-26 HISTORY — DX: Other specified postprocedural states: Z98.890

## 2014-01-26 NOTE — Pre-Procedure Instructions (Addendum)
CBC, DIFF, CMET REPORTS IN EPIC FROM 01/12/14. EKG REPORT WITH CARDIOLOGY OFFICE NOTE DR. Allyson SabalBERRY IN EPIC FROM 09-12-13. [facility]. URINE PREGNANCY TEST TO BE DONE ON ARRIVAL FOR SURGERY. PT'S CHART WAS GIVEN TO FOLLOW UP NURSE SHARON SHOFFNER, RN FOR REVIEW OF PREOP CXR RESULTS.

## 2014-01-26 NOTE — Patient Instructions (Addendum)
STOP ASPIRIN, BLOOD THINNERS, HERBALS 5 DAYS BEFORE SURGERY.                    [facility]  ON:  Tuesday  11/17  REPORT TO  SHORT STAY CENTER AT:  5:30 AM   DO NOT EAT OR DRINK ANYTHING AFTER MIDNIGHT THE NIGHT BEFORE YOUR SURGERY.  YOU MAY BRUSH YOUR TEETH, RINSE OUT YOUR MOUTH--BUT NO WATER, NO FOOD, NO CHEWING GUM, NO MINTS, NO CANDIES, NO CHEWING TOBACCO.  PLEASE TAKE THE FOLLOWING MEDICATIONS THE AM OF YOUR SURGERY WITH A FEW SIPS OF WATER:  NO MEDICATIONS TO TAKE - MAY USE VISINE EYE DROPS    DO NOT BRING VALUABLES, MONEY, CREDIT CARDS.  DO NOT WEAR JEWELRY, MAKE-UP, NAIL POLISH AND NO METAL PINS OR CLIPS IN YOUR HAIR. CONTACT LENS, DENTURES / PARTIALS, GLASSES SHOULD NOT BE WORN TO SURGERY AND IN MOST CASES-HEARING AIDS WILL NEED TO BE REMOVED.  BRING YOUR GLASSES CASE, ANY EQUIPMENT NEEDED FOR YOUR CONTACT LENS. FOR PATIENTS ADMITTED TO THE HOSPITAL--CHECK OUT TIME THE DAY OF DISCHARGE IS 11:00 AM.  ALL INPATIENT ROOMS ARE PRIVATE - WITH BATHROOM, TELEPHONE, TELEVISION AND WIFI INTERNET.    PLEASE BE AWARE THAT YOU MAY NEED ADDITIONAL BLOOD DRAWN DAY OF YOUR SURGERY  _______________________________________________________________________   [facility] - Preparing [facility] Before surgery, you can play an important role.  Because skin is not sterile, your skin needs to be as free of germs as possible.  You can reduce the number of germs on your skin by washing with CHG (chlorahexidine gluconate) soap before surgery.  CHG is an antiseptic cleaner which kills germs and bonds with the skin to continue killing germs even after washing. Please DO NOT use if you have an allergy to CHG or antibacterial soaps.  If your skin becomes reddened/irritated stop using the CHG and inform your nurse when you arrive at Short Stay. Do not shave (including legs and underarms) for at least 48 hours prior to the first CHG shower.  You may shave your  face/neck. Please follow these instructions carefully:  1.  Shower with CHG Soap the night before surgery and the  morning of Surgery.  2.  If you choose to wash your hair, wash your hair first as usual with your  normal  shampoo.  3.  After you shampoo, rinse your hair and body thoroughly to remove the  shampoo.                           4.  Use CHG as you would any other liquid soap.  You can apply chg directly  to the skin and wash                       Gently with a scrungie or clean washcloth.  5.  Apply the CHG Soap to your body ONLY FROM THE NECK DOWN.   Do not use on face/ open                           Wound or open sores. Avoid contact with eyes, ears mouth and genitals (private parts).                       Wash face,  Genitals (private parts) with your normal soap.  6.  Wash thoroughly, paying special attention to the area where your surgery  will be performed.  7.  Thoroughly rinse your body with warm water from the neck down.  8.  DO NOT shower/wash with your normal soap after using and rinsing off  the CHG Soap.                9.  Pat yourself dry with a clean towel.            10.  Wear clean pajamas.            11.  Place clean sheets on your bed the night of your first shower and do not  sleep with pets. Day of Surgery : Do not apply any lotions/deodorants the morning of surgery.  Please wear clean clothes to the hospital/surgery center.  FAILURE TO FOLLOW THESE INSTRUCTIONS MAY RESULT IN THE CANCELLATION OF YOUR SURGERY PATIENT SIGNATURE_________________________________  NURSE SIGNATURE__________________________________  ________________________________________________________________________

## 2014-01-30 ENCOUNTER — Encounter (HOSPITAL_COMMUNITY): Payer: Self-pay | Admitting: Anesthesiology

## 2014-01-30 NOTE — Progress Notes (Signed)
Quick Note:  Pre-operative chest x-ray is acceptable for scheduled surgery.  Cassandra Jacobson M. Omie Ferger, MD, [facility] Office: [phone]   ______ 

## 2014-01-30 NOTE — Progress Notes (Signed)
Quick Note:  Pre-operative chest x-ray is acceptable for scheduled surgery.  Elmer Merwin M. Sheryl Towell, MD, [facility] Office: [phone]   ______ 

## 2014-01-30 NOTE — Anesthesia Preprocedure Evaluation (Signed)
Anesthesia Evaluation  Patient identified by MRN, date of birth, ID band Patient awake    Reviewed: Allergy & Precautions, H&P , NPO status , Patient's Chart, lab work & pertinent test results  History of Anesthesia Complications (+) PONV and history of anesthetic complications  Airway Mallampati: II  TM Distance: >3 FB Neck ROM: Full    Dental no notable dental hx.    Pulmonary neg pulmonary ROS,  breath sounds clear to auscultation  Pulmonary exam normal       Cardiovascular Exercise Tolerance: Good hypertension, Pt. on medications Rhythm:Regular Rate:Normal     Neuro/Psych  Headaches, negative psych ROS   GI/Hepatic negative GI ROS, Neg liver ROS,   Endo/Other  negative endocrine ROS  Renal/GU negative Renal ROS  negative genitourinary   Musculoskeletal negative musculoskeletal ROS (+)   Abdominal   Peds negative pediatric ROS (+)  Hematology negative hematology ROS (+)   Anesthesia Other Findings   Reproductive/Obstetrics negative OB ROS                             Anesthesia Physical Anesthesia Plan  ASA: II  Anesthesia Plan: General   Post-op Pain Management:    Induction: Intravenous  Airway Management Planned: Oral ETT  Additional Equipment:   Intra-op Plan:   Post-operative Plan: Extubation in OR  Informed Consent: I have reviewed the patients History and Physical, chart, labs and discussed the procedure including the risks, benefits and alternatives for the proposed anesthesia with the patient or authorized representative who has indicated his/her understanding and acceptance.   Dental advisory given  Plan Discussed with: CRNA  Anesthesia Plan Comments:         Anesthesia Quick Evaluation

## 2014-01-31 ENCOUNTER — Ambulatory Visit (HOSPITAL_COMMUNITY): Payer: BC Managed Care – PPO | Admitting: Anesthesiology

## 2014-01-31 ENCOUNTER — Encounter (HOSPITAL_COMMUNITY): Payer: Self-pay | Admitting: *Deleted

## 2014-01-31 ENCOUNTER — Encounter (HOSPITAL_COMMUNITY)
Admission: RE | Disposition: A | Discharge status: Home / Self Care | Payer: Self-pay | Source: Ambulatory Visit | Attending: Surgery

## 2014-01-31 ENCOUNTER — Ambulatory Visit (HOSPITAL_COMMUNITY): Payer: BC Managed Care – PPO

## 2014-01-31 ENCOUNTER — Observation Stay (HOSPITAL_COMMUNITY)
Admission: RE | Admit: 2014-01-31 | Discharge: 2014-02-01 | Disposition: A | Discharge status: Home / Self Care | Payer: BC Managed Care – PPO | Source: Ambulatory Visit | Attending: Surgery | Admitting: Surgery

## 2014-01-31 DIAGNOSIS — K8010 Calculus of gallbladder with chronic cholecystitis without obstruction: Secondary | ICD-10-CM | POA: Diagnosis present

## 2014-01-31 DIAGNOSIS — I10 Essential (primary) hypertension: Secondary | ICD-10-CM | POA: Diagnosis not present

## 2014-01-31 DIAGNOSIS — Z419 Encounter for procedure for purposes other than remedying health state, unspecified: Secondary | ICD-10-CM

## 2014-01-31 DIAGNOSIS — F419 Anxiety disorder, unspecified: Secondary | ICD-10-CM | POA: Diagnosis not present

## 2014-01-31 DIAGNOSIS — M549 Dorsalgia, unspecified: Secondary | ICD-10-CM | POA: Diagnosis not present

## 2014-01-31 DIAGNOSIS — Z87442 Personal history of urinary calculi: Secondary | ICD-10-CM | POA: Insufficient documentation

## 2014-01-31 HISTORY — PX: CHOLECYSTECTOMY: SHX55

## 2014-01-31 LAB — PREGNANCY, URINE: Preg Test, Ur: NEGATIVE

## 2014-01-31 SURGERY — LAPAROSCOPIC CHOLECYSTECTOMY WITH INTRAOPERATIVE CHOLANGIOGRAM
Anesthesia: General | Site: Abdomen

## 2014-01-31 MED ORDER — HYDROCODONE-ACETAMINOPHEN 5-325 MG PO TABS
1.0000 | ORAL_TABLET | ORAL | Status: DC | PRN
  Administered 2014-01-31 (×2): 2 via ORAL
  Administered 2014-02-01: 1 via ORAL
  Filled 2014-01-31 (×2): qty 1
  Filled 2014-01-31: qty 2
  Filled 2014-01-31: qty 1

## 2014-01-31 MED ORDER — LACTATED RINGERS IV SOLN
INTRAVENOUS | Status: DC | PRN
  Administered 2014-01-31 (×2): via INTRAVENOUS

## 2014-01-31 MED ORDER — HYDROMORPHONE HCL 1 MG/ML IJ SOLN
0.2500 mg | INTRAMUSCULAR | Status: DC | PRN
  Administered 2014-01-31 (×4): 0.5 mg via INTRAVENOUS

## 2014-01-31 MED ORDER — HYDROMORPHONE HCL 1 MG/ML IJ SOLN
INTRAMUSCULAR | Status: AC
  Filled 2014-01-31: qty 1

## 2014-01-31 MED ORDER — MIDAZOLAM HCL 5 MG/5ML IJ SOLN
INTRAMUSCULAR | Status: DC | PRN
  Administered 2014-01-31: 2 mg via INTRAVENOUS

## 2014-01-31 MED ORDER — SODIUM CHLORIDE 0.9 % IJ SOLN
INTRAMUSCULAR | Status: AC
  Filled 2014-01-31: qty 10

## 2014-01-31 MED ORDER — SCOPOLAMINE 1 MG/3DAYS TD PT72
MEDICATED_PATCH | TRANSDERMAL | Status: AC
  Filled 2014-01-31: qty 1

## 2014-01-31 MED ORDER — LIDOCAINE HCL (PF) 2 % IJ SOLN
INTRAMUSCULAR | Status: DC | PRN
  Administered 2014-01-31: 20 mg via INTRADERMAL

## 2014-01-31 MED ORDER — FENTANYL CITRATE 0.05 MG/ML IJ SOLN
INTRAMUSCULAR | Status: AC
  Filled 2014-01-31: qty 5

## 2014-01-31 MED ORDER — CEFAZOLIN SODIUM-DEXTROSE 2-3 GM-% IV SOLR
2.0000 g | INTRAVENOUS | Status: AC
  Administered 2014-01-31: 2 g via INTRAVENOUS

## 2014-01-31 MED ORDER — SCOPOLAMINE 1 MG/3DAYS TD PT72
MEDICATED_PATCH | TRANSDERMAL | Status: DC | PRN
  Administered 2014-01-31: 1 via TRANSDERMAL

## 2014-01-31 MED ORDER — IOHEXOL 300 MG/ML  SOLN
INTRAMUSCULAR | Status: DC | PRN
  Administered 2014-01-31: 7 mL

## 2014-01-31 MED ORDER — ONDANSETRON HCL 4 MG/2ML IJ SOLN
INTRAMUSCULAR | Status: AC
  Filled 2014-01-31: qty 2

## 2014-01-31 MED ORDER — BUPIVACAINE-EPINEPHRINE 0.5% -1:200000 IJ SOLN
INTRAMUSCULAR | Status: DC | PRN
  Administered 2014-01-31: 20 mL

## 2014-01-31 MED ORDER — PROPOFOL 10 MG/ML IV BOLUS
INTRAVENOUS | Status: AC
  Filled 2014-01-31: qty 20

## 2014-01-31 MED ORDER — GLYCOPYRROLATE 0.2 MG/ML IJ SOLN
INTRAMUSCULAR | Status: AC
  Filled 2014-01-31: qty 2

## 2014-01-31 MED ORDER — DULOXETINE HCL 30 MG PO CPEP
30.0000 mg | ORAL_CAPSULE | Freq: Every day | ORAL | Status: DC
  Administered 2014-01-31: 30 mg via ORAL
  Filled 2014-01-31 (×2): qty 1

## 2014-01-31 MED ORDER — 0.9 % SODIUM CHLORIDE (POUR BTL) OPTIME
TOPICAL | Status: DC | PRN
  Administered 2014-01-31: 1000 mL

## 2014-01-31 MED ORDER — BUPIVACAINE-EPINEPHRINE (PF) 0.5% -1:200000 IJ SOLN
INTRAMUSCULAR | Status: AC
  Filled 2014-01-31: qty 30

## 2014-01-31 MED ORDER — HYDROMORPHONE HCL 1 MG/ML IJ SOLN: 1.0000 mg | INTRAMUSCULAR | Status: DC | PRN

## 2014-01-31 MED ORDER — ROCURONIUM BROMIDE 100 MG/10ML IV SOLN
INTRAVENOUS | Status: DC | PRN
  Administered 2014-01-31: 20 mg via INTRAVENOUS

## 2014-01-31 MED ORDER — EPHEDRINE SULFATE 50 MG/ML IJ SOLN
INTRAMUSCULAR | Status: AC
  Filled 2014-01-31: qty 1

## 2014-01-31 MED ORDER — ACETAMINOPHEN 325 MG PO TABS
650.0000 mg | ORAL_TABLET | ORAL | Status: DC | PRN
  Administered 2014-02-01: 650 mg via ORAL
  Filled 2014-01-31: qty 2

## 2014-01-31 MED ORDER — LACTATED RINGERS IV SOLN: INTRAVENOUS | Status: DC

## 2014-01-31 MED ORDER — DEXAMETHASONE SODIUM PHOSPHATE 10 MG/ML IJ SOLN
INTRAMUSCULAR | Status: DC | PRN
  Administered 2014-01-31: 10 mg via INTRAVENOUS

## 2014-01-31 MED ORDER — CEFAZOLIN SODIUM-DEXTROSE 2-3 GM-% IV SOLR
INTRAVENOUS | Status: AC
  Filled 2014-01-31: qty 50

## 2014-01-31 MED ORDER — NEOSTIGMINE METHYLSULFATE 10 MG/10ML IV SOLN
INTRAVENOUS | Status: AC
  Filled 2014-01-31: qty 1

## 2014-01-31 MED ORDER — EPHEDRINE SULFATE 50 MG/ML IJ SOLN
INTRAMUSCULAR | Status: DC | PRN
  Administered 2014-01-31: 10 mg via INTRAVENOUS

## 2014-01-31 MED ORDER — PROMETHAZINE HCL 25 MG/ML IJ SOLN
12.5000 mg | Freq: Once | INTRAMUSCULAR | Status: AC
  Administered 2014-01-31: 12.5 mg via INTRAVENOUS
  Filled 2014-01-31: qty 1

## 2014-01-31 MED ORDER — KCL IN DEXTROSE-NACL 20-5-0.45 MEQ/L-%-% IV SOLN
INTRAVENOUS | Status: DC
  Administered 2014-01-31: 12:00:00 via INTRAVENOUS
  Filled 2014-01-31 (×2): qty 1000

## 2014-01-31 MED ORDER — ONDANSETRON HCL 4 MG PO TABS: 4.0000 mg | ORAL_TABLET | Freq: Four times a day (QID) | ORAL | Status: DC | PRN

## 2014-01-31 MED ORDER — MIDAZOLAM HCL 2 MG/2ML IJ SOLN
INTRAMUSCULAR | Status: AC
  Filled 2014-01-31: qty 2

## 2014-01-31 MED ORDER — LISINOPRIL 5 MG PO TABS
5.0000 mg | ORAL_TABLET | Freq: Every day | ORAL | Status: DC
  Administered 2014-01-31: 5 mg via ORAL
  Filled 2014-01-31 (×2): qty 1

## 2014-01-31 MED ORDER — FENTANYL CITRATE 0.05 MG/ML IJ SOLN
INTRAMUSCULAR | Status: DC | PRN
  Administered 2014-01-31 (×2): 50 ug via INTRAVENOUS
  Administered 2014-01-31: 100 ug via INTRAVENOUS
  Administered 2014-01-31: 50 ug via INTRAVENOUS

## 2014-01-31 MED ORDER — SUCCINYLCHOLINE CHLORIDE 20 MG/ML IJ SOLN
INTRAMUSCULAR | Status: DC | PRN
  Administered 2014-01-31: 80 mg via INTRAVENOUS

## 2014-01-31 MED ORDER — LACTATED RINGERS IR SOLN
Status: DC | PRN
  Administered 2014-01-31: 1000 mL

## 2014-01-31 MED ORDER — PROMETHAZINE HCL 25 MG/ML IJ SOLN: 6.2500 mg | INTRAMUSCULAR | Status: DC | PRN

## 2014-01-31 MED ORDER — ONDANSETRON HCL 4 MG/2ML IJ SOLN
INTRAMUSCULAR | Status: DC | PRN
  Administered 2014-01-31: 4 mg via INTRAVENOUS

## 2014-01-31 MED ORDER — ROCURONIUM BROMIDE 100 MG/10ML IV SOLN
INTRAVENOUS | Status: AC
  Filled 2014-01-31: qty 1

## 2014-01-31 MED ORDER — PROPOFOL 10 MG/ML IV BOLUS
INTRAVENOUS | Status: DC | PRN
  Administered 2014-01-31: 150 mg via INTRAVENOUS

## 2014-01-31 MED ORDER — ONDANSETRON HCL 4 MG/2ML IJ SOLN
4.0000 mg | Freq: Four times a day (QID) | INTRAMUSCULAR | Status: DC | PRN
  Administered 2014-01-31 (×2): 4 mg via INTRAVENOUS
  Filled 2014-01-31 (×3): qty 2

## 2014-01-31 MED ORDER — NEOSTIGMINE METHYLSULFATE 10 MG/10ML IV SOLN
INTRAVENOUS | Status: DC | PRN
  Administered 2014-01-31: 3 mg via INTRAVENOUS

## 2014-01-31 MED ORDER — LIDOCAINE HCL (CARDIAC) 20 MG/ML IV SOLN
INTRAVENOUS | Status: AC
  Filled 2014-01-31: qty 5

## 2014-01-31 MED ORDER — GLYCOPYRROLATE 0.2 MG/ML IJ SOLN
INTRAMUSCULAR | Status: DC | PRN
  Administered 2014-01-31: 0.4 mg via INTRAVENOUS

## 2014-01-31 MED ORDER — DEXAMETHASONE SODIUM PHOSPHATE 10 MG/ML IJ SOLN
INTRAMUSCULAR | Status: AC
  Filled 2014-01-31: qty 1

## 2014-01-31 SURGICAL SUPPLY — 37 items
APL SKNCLS STERI-STRIP NONHPOA (GAUZE/BANDAGES/DRESSINGS) ×1
APPLIER CLIP ROT 10 11.4 M/L (STAPLE) ×3
APR CLP MED LRG 11.4X10 (STAPLE) ×1
BAG SPEC RTRVL LRG 6X4 10 (ENDOMECHANICALS) ×1
BENZOIN TINCTURE PRP APPL 2/3 (GAUZE/BANDAGES/DRESSINGS) ×3 IMPLANT
CABLE HIGH FREQUENCY MONO STRZ (ELECTRODE) ×3 IMPLANT
CANISTER SUCTION 2500CC (MISCELLANEOUS) ×1 IMPLANT
CHLORAPREP W/TINT 26ML (MISCELLANEOUS) ×3 IMPLANT
CLIP APPLIE ROT 10 11.4 M/L (STAPLE) ×1 IMPLANT
CLOSURE WOUND 1/2 X4 (GAUZE/BANDAGES/DRESSINGS) ×1
COVER MAYO STAND STRL (DRAPES) ×3 IMPLANT
DECANTER SPIKE VIAL GLASS SM (MISCELLANEOUS) ×3 IMPLANT
DRAPE C-ARM 42X120 X-RAY (DRAPES) ×3 IMPLANT
DRAPE LAPAROSCOPIC ABDOMINAL (DRAPES) ×3 IMPLANT
DRAPE UTILITY XL STRL (DRAPES) ×3 IMPLANT
ELECT REM PT RETURN 9FT ADLT (ELECTROSURGICAL) ×3
ELECTRODE REM PT RTRN 9FT ADLT (ELECTROSURGICAL) ×1 IMPLANT
GLOVE SURG ORTHO 8.0 STRL STRW (GLOVE) ×3 IMPLANT
GOWN STRL REUS W/TWL XL LVL3 (GOWN DISPOSABLE) ×6 IMPLANT
HEMOSTAT SURGICEL 4X8 (HEMOSTASIS) IMPLANT
KIT BASIN OR (CUSTOM PROCEDURE TRAY) ×3 IMPLANT
NS IRRIG 1000ML POUR BTL (IV SOLUTION) ×3 IMPLANT
POUCH SPECIMEN RETRIEVAL 10MM (ENDOMECHANICALS) ×3 IMPLANT
SCISSORS LAP 5X35 DISP (ENDOMECHANICALS) ×3 IMPLANT
SET CHOLANGIOGRAPH MIX (MISCELLANEOUS) ×3 IMPLANT
SET IRRIG TUBING LAPAROSCOPIC (IRRIGATION / IRRIGATOR) ×3 IMPLANT
SLEEVE XCEL OPT CAN 5 100 (ENDOMECHANICALS) ×3 IMPLANT
SOLUTION ANTI FOG 6CC (MISCELLANEOUS) ×3 IMPLANT
STRIP CLOSURE SKIN 1/2X4 (GAUZE/BANDAGES/DRESSINGS) ×2 IMPLANT
SUT MNCRL AB 4-0 PS2 18 (SUTURE) ×3 IMPLANT
TOWEL OR 17X26 10 PK STRL BLUE (TOWEL DISPOSABLE) ×3 IMPLANT
TOWEL OR NON WOVEN STRL DISP B (DISPOSABLE) ×3 IMPLANT
TRAY LAPAROSCOPIC (CUSTOM PROCEDURE TRAY) ×3 IMPLANT
TROCAR BLADELESS OPT 5 100 (ENDOMECHANICALS) ×3 IMPLANT
TROCAR XCEL BLUNT TIP 100MML (ENDOMECHANICALS) ×3 IMPLANT
TROCAR XCEL NON-BLD 11X100MML (ENDOMECHANICALS) ×3 IMPLANT
TUBING INSUFFLATION 10FT LAP (TUBING) ×3 IMPLANT

## 2014-01-31 NOTE — Plan of Care (Signed)
Problem: Phase I Progression Outcomes Goal: Pain controlled with appropriate interventions Outcome: Completed/Met Date Met:  01/31/14 Goal: OOB as tolerated unless otherwise ordered Outcome: Completed/Met Date Met:  01/31/14 Goal: Incision/dressings dry and intact Outcome: Completed/Met Date Met:  01/31/14 Goal: Voiding-avoid urinary catheter unless indicated Outcome: Completed/Met Date Met:  01/31/14 Goal: Vital signs/hemodynamically stable Outcome: Completed/Met Date Met:  01/31/14

## 2014-01-31 NOTE — Op Note (Signed)
Procedure Note  Pre-operative Diagnosis:  Chronic cholecystitis, cholelithiasis  Post-operative Diagnosis:  same  Surgeon:  Velora Hecklerodd M. Kavonte Bearse, MD, FACS  Assistant:  none   Procedure:  Laparoscopic cholecystectomy with intra-operative cholangiography  Anesthesia:  General  Estimated Blood Loss:  minimal  Drains: none         Specimen: Gallbladder to pathology  Indications:  Patient presents with symptomatic cholelithiasis.  Procedure Details:  The patient was seen in the pre-op holding area. The risks, benefits, complications, treatment options, and expected outcomes were previously discussed with the patient. The patient agreed with the proposed plan and has signed the informed consent form.  The patient was brought to the Operating Room, identified as Cassandra Jacobson and the procedure verified as laparoscopic cholecystectomy with intraoperative cholangiography. A "time out" was completed and the above information confirmed.  The patient was placed in the supine position. Following induction of general anesthesia, the abdomen was prepped and draped in the usual aseptic fashion.  An incision was made in the skin near the umbilicus. The midline fascia was incised and the peritoneal cavity was entered and a Hasson canula was introduced under direct vision.  The Hasson canula was secured with a 0-Vicryl pursestring suture. Pneumoperitoneum was established with carbon dioxide. Additional trocars were introduced under direct vision along the right costal margin in the midline, mid-clavicular line, and anterior axillary line.  The liver was inspected and the known cyst in the left lobe was not visible on the suface.  No abnormality was identified grossly.   The gallbladder was identified and the fundus grasped and retracted cephalad. Adhesions were taken down bluntly and the electrocautery was utilized as needed, taking care not to injure any adjacent structures. The infundibulum was grasped and  retracted laterally, exposing the peritoneum overlying the triangle of Calot. The peritoneum was incised and structures exposed with blunt dissection. The cystic duct was clearly identified, bluntly dissected circumferentially, and clipped at the neck of the gallbladder.  An incision was made in the cystic duct and the cholangiogram catheter introduced. The catheter was secured using an ligaclip.  Real-time cholangiography was performed using C-arm fluoroscopy.  There was rapid filling of a normal caliber common bile duct.  There was reflux of contrast into the left and right hepatic ductal systems.  There was free flow distally into the duodenum without filling defect or obstruction.  The catheter was removed from the peritoneal cavity.  The cystic duct was then ligated with surgical clips and divided. The cystic artery was identified, dissected circumferentially, ligated with ligaclips, and divided.  The gallbladder was dissected away from the liver bed using the electrocautery for hemostasis. The gallbladder was completely removed from the liver and placed into an endocatch bag. The gallbladder was removed in the endocatch bag through the umbilical port site and submitted to pathology for review.  The right upper quadrant was irrigated and the gallbladder bed was inspected. Hemostasis was achieved with the electrocautery.  Pneumoperitoneum was released after viewing removal of the trocars with good hemostasis noted. The umbilical wound was irrigated and the fascia was then closed with the pursestring suture.  Local anesthetic was infiltrated at all port sites. The skin incisions were closed with 4-0 Monocril subcuticular sutures and steri-strips and dressings were applied.  Instrument, sponge, and needle counts were correct at the conclusion of the case.  The patient was awakened from anesthesia and brought to the recovery room in stable condition.  The patient tolerated the procedure well.  Velora Hecklerodd  M. Dillyn Joaquin, MD, [facility] Office: [phone]

## 2014-01-31 NOTE — Anesthesia Postprocedure Evaluation (Signed)
  Anesthesia Post-op Note  Patient: Cassandra Jacobson  Procedure(s) Performed: Procedure(s) (LRB): LAPAROSCOPIC CHOLECYSTECTOMY WITH INTRAOPERATIVE CHOLANGIOGRAM (N/A)  Patient Location: PACU  Anesthesia Type: General  Level of Consciousness: awake and alert   Airway and Oxygen Therapy: Patient Spontanous Breathing  Post-op Pain: mild  Post-op Assessment: Post-op Vital signs reviewed, Patient's Cardiovascular Status Stable, Respiratory Function Stable, Patent Airway and No signs of Nausea or vomiting  Last Vitals:  Filed Vitals:   01/31/14 1244  BP: 127/68  Pulse: 105  Temp: 36.3 C  Resp: 16    Post-op Vital Signs: stable   Complications: No apparent anesthesia complications

## 2014-01-31 NOTE — H&P (Signed)
General Surgery [facility]  Cassandra Jacobson 01/16/2014 1:53 PM Location: [facility] Patient #: [id number] DOB: [birth year] Married / Language: Undefined / Race: Undefined Female  History of Present Illness Cassandra Jacobson(Deyvi Bonanno M. Antonio Creswell MD; 01/16/2014 2:19 PM) The patient is a 53 year old female who presents for evaluation of gallbladder disease. Patient is referred by Dr. Herb Graysammy Spear for evaluation of symptomatic cholelithiasis.  Patient presents with a two-month history of intermittent nighttime he indigestion. This past week, following a fatty late dinner, she experienced severe epigastric abdominal pain radiating to the back. She notes bloating but no nausea or emesis. Patient presented to the emergency department where ultrasound demonstrated multiple gallstones. Also noted was a 2.8 cm complex cyst in the medial aspect of the left lobe of the liver. Gallbladder wall was moderately thickened and edematous. Laboratory studies were within normal limits. Patient is now referred for consideration for cholecystectomy for symptomatic cholelithiasis and chronic cholecystitis.  There is a family history of cholecystectomy in the patient's father. Patient denies any history of jaundice or acholic stools. She denies fevers or chills. She has had symptoms of irritable bowel syndrome in the past. Previous abdominal surgery includes cesarean section and left oophorectomy.    Other Problems Cassandra Jacobson(Cassandra Smithey, LPN; 81/1/914711/04/2013 8:291:53 PM) Anxiety Disorder Back Pain Bladder Problems Cholelithiasis High blood pressure Kidney Stone Migraine Headache Oophorectomy  Past Surgical History Cassandra Jacobson(Cassandra Smithey, LPN; 56/2/130811/04/2013 6:571:53 PM) Cesarean Section - Multiple Oral Surgery  Diagnostic Studies History Cassandra Jacobson(Cassandra Smithey, LPN; 84/6/962911/04/2013 5:281:53 PM) Colonoscopy never Mammogram within last year Pap Smear 1-5 years ago  Allergies Cassandra Jacobson(Cassandra Smithey, LPN; 41/3/244011/04/2013 1:021:54 PM) No Known Drug  Allergies11/04/2013  Medication History Cassandra Jacobson(Cassandra Smithey, LPN; 72/5/366411/04/2013 4:031:54 PM) Hydrocodone-Acetaminophen (Oral) Specific dose unknown - Active. DULoxetine HCl (Oral) Specific dose unknown - Active. Lisinopril (Oral) Specific dose unknown - Active. Sprintec 28 (Oral) Specific dose unknown - Active.  Social History Cassandra Jacobson(Cassandra DublinSmithey, LPN; 47/4/259511/04/2013 6:381:53 PM) Alcohol use Occasional alcohol use. Caffeine use Coffee, Tea. No drug use Tobacco use Never smoker.  Family History Cassandra Jacobson(Cassandra Smithey, LPN; 75/6/433211/04/2013 9:511:53 PM) Alcohol Abuse Father. Breast Cancer Mother. Cancer Family Members In General. Colon Polyps Mother. Depression Father. Heart Disease Mother. Hypertension Mother. Melanoma Father. Migraine Headache Daughter. Ovarian Cancer Family Members In General. Thyroid problems Mother.  Pregnancy / Birth History Cassandra Jacobson(Cassandra Smithey, LPN; 88/4/166011/04/2013 6:301:53 PM) Age at menarche 14 years. Contraceptive History Oral contraceptives. Gravida 3 Maternal age 52-35 Para 2 Regular periods  Review of Systems Cassandra Jacobson(Cassandra Smithey LPN; 16/0/109311/04/2013 2:351:53 PM) General Not Present- Appetite Loss, Chills, Fatigue, Fever, Night Sweats, Weight Gain and Weight Loss. Skin Not Present- Change in Wart/Mole, Dryness, Hives, Jaundice, New Lesions, Non-Healing Wounds, Rash and Ulcer. HEENT Present- Seasonal Allergies and Wears glasses/contact lenses. Not Present- Earache, Hearing Loss, Hoarseness, Nose Bleed, Oral Ulcers, Ringing in the Ears, Sinus Pain, Sore Throat, Visual Disturbances and Yellow Eyes. Respiratory Not Present- Bloody sputum, Chronic Cough, Difficulty Breathing, Snoring and Wheezing. Breast Not Present- Breast Mass, Breast Pain, Nipple Discharge and Skin Changes. Cardiovascular Not Present- Chest Pain, Difficulty Breathing Lying Down, Leg Cramps, Palpitations, Rapid Heart Rate, Shortness of Breath and Swelling of Extremities. Gastrointestinal Present- Abdominal Pain, Bloating and  Constipation. Not Present- Bloody Stool, Change in Bowel Habits, Chronic diarrhea, Difficulty Swallowing, Excessive gas, Gets full quickly at meals, Hemorrhoids, Indigestion, Nausea, Rectal Pain and Vomiting. Female Genitourinary Not Present- Frequency, Nocturia, Painful Urination, Pelvic Pain and Urgency. Musculoskeletal Not Present- Back Pain, Joint Pain, Joint Stiffness, Muscle Pain, Muscle  Weakness and Swelling of Extremities. Neurological Not Present- Decreased Memory, Fainting, Headaches, Numbness, Seizures, Tingling, Tremor, Trouble walking and Weakness. Psychiatric Not Present- Anxiety, Bipolar, Change in Sleep Pattern, Depression, Fearful and Frequent crying. Endocrine Present- Hot flashes. Not Present- Cold Intolerance, Excessive Hunger, Hair Changes, Heat Intolerance and New Diabetes. Hematology Not Present- Easy Bruising, Excessive bleeding, Gland problems, HIV and Persistent Infections.   Vitals (Cassandra Smithey LPN; 01/16/2014 1:55 PM) 01/16/2014 1:54 PM Weight: 149 lb Height: 62in Body Surface Area: 1.72 m Body Mass Index: 27.25 kg/m Temp.: 98F(Oral)  Pulse: 76 (Regular)  Resp.: 18 (Unlabored)  BP: 140/86 (Sitting, Left Arm, Standard)    Physical Exam (Aland Chestnutt M. Shandell Giovanni MD; 01/16/2014 2:19 PM) The physical exam findings are as follows: Note:General - appears comfortable, no distress; not diaphorectic  HEENT - normocephalic; sclerae clear, gaze conjugate; mucous membranes moist, dentition good; voice normal  Neck - symmetric on extension; no palpable anterior or posterior cervical adenopathy; no palpable masses in the thyroid bed  Chest - clear bilaterally with rhonchi, rales, or wheeze  Cor - regular rhythm with normal rate; no significant murmur  Abd - soft without distension; no mass; no tenderness; no HSM  Ext - non-tender without significant edema or lymphedema  Neuro - grossly intact; no tremor    Assessment & Plan (Nakesha Ebrahim M. Haru Anspaugh MD; 01/16/2014  2:21 PM) CHOLELITHIASIS WITH CHOLECYSTITIS (574.10  K80.10) Current Plans  Instructed to keep follow-up appointment as scheduled  The patient I reviewed her ultrasound report including the cystic lesion in the liver. We reviewed her laboratory studies. I provided her with written literature on laparoscopic cholecystectomy to review at home.  Patient has symptomatic cholelithiasis and probable chronic cholecystitis. There is likely a small benign cyst in the left lobe of the liver. I have recommended proceeding with laparoscopic cholecystectomy with intraoperative cholangiography. We will also attempt to visualize the cystic lesion in the liver at the time of surgery. Risk and benefits of the procedure discussed including the possibility of conversion to open surgery. We have discussed the hospital stay and postoperative recovery. Patient would like to proceed in the near future.  The risks and benefits of the procedure have been discussed at length with the patient. The patient understands the proposed procedure, potential alternative treatments, and the course of recovery to be expected. All of the patient's questions have been answered at this time. The patient wishes to proceed with surgery.  Deloros Beretta M. Terilynn Buresh, MD, FACS [facility] Office: [phone]   

## 2014-01-31 NOTE — Transfer of Care (Signed)
Immediate Anesthesia Transfer of Care Note  Patient: Cassandra Jacobson  Procedure(s) Performed: Procedure(s) (LRB): LAPAROSCOPIC CHOLECYSTECTOMY WITH INTRAOPERATIVE CHOLANGIOGRAM (N/A)  Patient Location: PACU  Anesthesia Type: General  Level of Consciousness: sedated, patient cooperative and responds to stimulation  Airway & Oxygen Therapy: Patient Spontanous Breathing and Patient connected to face mask oxgen  Post-op Assessment: Report given to PACU RN and Post -op Vital signs reviewed and stable  Post vital signs: Reviewed and stable  Complications: No apparent anesthesia complications

## 2014-02-01 DIAGNOSIS — K8010 Calculus of gallbladder with chronic cholecystitis without obstruction: Secondary | ICD-10-CM | POA: Diagnosis not present

## 2014-02-01 MED ORDER — HYDROCODONE-ACETAMINOPHEN 5-325 MG PO TABS: 1.0000 | ORAL_TABLET | ORAL | Status: DC | PRN

## 2014-02-01 NOTE — Progress Notes (Signed)
Discharge instructions and medications reviewed with patient. Patient verbalizes understanding, has no questions at this time. Patient confirms she has all personal belongings in her possession. Patient discharged home via private transportation provided by husband.

## 2014-02-01 NOTE — Discharge Summary (Signed)
Physician Discharge Summary [facility]  Patient ID: Cassandra Jacobson MRN: [id number] DOB/AGE: 08-04-1960 53 y.o.  Admit date: 01/31/2014 Discharge date: 02/01/2014  Admission Diagnoses:  cholelithiasis  Discharge Diagnoses:  Principal Problem:   Cholelithiasis with cholecystitis   Discharged Condition: good  Hospital Course: patient admitted for observation after lap chole with IOC.  Post op course notable for mild nausea and emesis, now resolved.  Prepared for discharge POD#1.  Consults: None  Treatments: surgery: lap chole with IOC  Discharge Exam: Blood pressure 113/49, pulse 78, temperature 98.4 F (36.9 C), temperature source Oral, resp. rate 16, height 5\' 2"  (1.575 m), weight 149 lb (67.586 kg), last menstrual period 01/19/2014, SpO2 100 %. HEENT - clear Neck - soft Chest - clear bilaterally Cor - RRR Abd - soft without distension; dressings intact  Disposition: Home  Discharge Instructions    Diet - low sodium heart healthy    Complete by:  As directed      Discharge instructions    Complete by:  As directed   [facility]  LAPAROSCOPIC SURGERY - POST-OP INSTRUCTIONS  Always review your discharge instruction sheet given to you by the facility where your surgery was performed.  A prescription for pain medication may be given to you upon discharge.  Take your pain medication as prescribed.  If narcotic pain medicine is not needed, then you may take acetaminophen (Tylenol) or ibuprofen (Advil) as needed.  Take your usually prescribed medications unless otherwise directed.  If you need a refill on your pain medication, please contact your pharmacy.  They will contact our office to request authorization. Prescriptions will not be filled after 5 P.M. or on weekends.  You should follow a light diet the first few days after arrival home, such as soup and crackers or toast.  Be sure to include plenty of fluids daily.  Most  patients will experience some swelling and bruising in the area of the incisions.  Ice packs will help.  Swelling and bruising can take several days to resolve.   It is common to experience some constipation if taking pain medication after surgery.  Increasing fluid intake and taking a stool softener (such as Colace) will usually help or prevent this problem from occurring.  A mild laxative (Milk of Magnesia or Miralax) should be taken according to package instructions if there are no bowel movements after 48 hours.  Unless discharge instructions indicate otherwise, you may remove your bandages 24-48 hours after surgery, and you may shower at that time.  You may have steri-strips (small skin tapes) in place directly over the incision.  These strips should be left on the skin for 7-10 days.  If your surgeon used skin glue on the incision, you may shower in 24 hours.  The glue will flake off over the next 2-3 weeks.  Any sutures or staples will be removed at the office during your follow-up visit.  ACTIVITIES:  You may resume regular (light) daily activities beginning the next day-such as daily self-care, walking, climbing stairs-gradually increasing activities as tolerated.  You may have sexual intercourse when it is comfortable.  Refrain from any heavy lifting or straining until approved by your doctor.  You may drive when you are no longer taking prescription pain medication, you can comfortably wear a seatbelt, and you can safely maneuver your car and apply brakes.  You should see your doctor in the office for a follow-up appointment approximately 2-3 weeks after your surgery.  Make sure that you call for this appointment within a day or two after you arrive home to insure a convenient appointment time.  WHEN TO CALL YOUR DOCTOR: Fever over 101.0 Inability to urinate Continued bleeding from incision Increased pain, redness, or drainage from the incision Increasing abdominal pain  The clinic  staff is available to answer your questions during regular business hours.  Please don't hesitate to call and ask to speak to one of the nurses for clinical concerns.  If you have a medical emergency, go to the nearest emergency room or call 911.  [facility] is always on call for the hospital.  Velora Hecklerodd M. Ashleyann Shoun, MD, [facility] Office: [phone] Toll Free:  [phone] FAX [phone]  Web site: [email or web address]     Increase activity slowly    Complete by:  As directed      Remove dressing in 24 hours    Complete by:  As directed             Medication List    TAKE these medications        aspirin-acetaminophen-caffeine 250-250-65 MG per tablet  Commonly known as:  EXCEDRIN MIGRAINE  Take 1 tablet by mouth every 6 (six) hours as needed for headache or migraine.     DULoxetine 30 MG capsule  Commonly known as:  CYMBALTA  Take 30 mg by mouth at bedtime.     fluticasone 27.5 MCG/SPRAY nasal spray  Commonly known as:  VERAMYST  Place 2 sprays into the nose daily as needed for rhinitis.     HYDROcodone-acetaminophen 5-325 MG per tablet  Commonly known as:  NORCO  Take 1-2 tablets by mouth every 6 (six) hours as needed.     HYDROcodone-acetaminophen 5-325 MG per tablet  Commonly known as:  NORCO/VICODIN  Take 1-2 tablets by mouth every 4 (four) hours as needed for moderate pain.     ibuprofen 200 MG tablet  Commonly known as:  ADVIL,MOTRIN  Take 200-400 mg by mouth daily as needed for headache.     lisinopril 5 MG tablet  Commonly known as:  PRINIVIL,ZESTRIL  Take 1 tablet (5 mg total) by mouth daily.     norgestimate-ethinyl estradiol 0.25-35 MG-MCG tablet  Commonly known as:  ORTHO-CYCLEN,SPRINTEC,PREVIFEM  Take 1 tablet by mouth at bedtime.     VISINE 0.05 % ophthalmic solution  Generic drug:  tetrahydrozoline  Place 1 drop into both eyes every morning.           Follow-up Information     Follow up with Velora HecklerGERKIN,Roanne Haye M, MD. Schedule an appointment as soon as possible for a visit in 3 weeks.   Specialty:  General Surgery   Why:  For wound re-check   Contact information:   [address] [place] [address] [phone]       Velora Hecklerodd M. Shawniece Oyola, MD, [facility] Office: [phone]   Signed: Velora HecklerGERKIN,Drystan Reader M 02/01/2014, 8:32 AM

## 2014-02-02 ENCOUNTER — Encounter (HOSPITAL_COMMUNITY): Payer: Self-pay | Admitting: Surgery

## 2014-04-11 ENCOUNTER — Other Ambulatory Visit: Payer: Self-pay | Admitting: Gastroenterology

## 2014-07-10 ENCOUNTER — Other Ambulatory Visit: Payer: Self-pay | Admitting: General Surgery

## 2014-07-10 DIAGNOSIS — K648 Other hemorrhoids: Secondary | ICD-10-CM

## 2014-07-24 ENCOUNTER — Ambulatory Visit (HOSPITAL_COMMUNITY)
Admission: RE | Admit: 2014-07-24 | Discharge: 2014-07-24 | Disposition: A | Discharge status: Home / Self Care | Payer: BLUE CROSS/BLUE SHIELD | Source: Ambulatory Visit | Attending: General Surgery | Admitting: General Surgery

## 2014-07-24 ENCOUNTER — Encounter (HOSPITAL_COMMUNITY)
Admission: RE | Disposition: A | Discharge status: Home / Self Care | Payer: Self-pay | Source: Ambulatory Visit | Attending: General Surgery

## 2014-07-24 DIAGNOSIS — R159 Full incontinence of feces: Secondary | ICD-10-CM | POA: Insufficient documentation

## 2014-07-24 HISTORY — PX: ANAL RECTAL MANOMETRY: SHX6358

## 2014-07-24 SURGERY — MANOMETRY, ANORECTAL

## 2014-07-25 ENCOUNTER — Encounter (HOSPITAL_COMMUNITY): Payer: Self-pay | Admitting: General Surgery

## 2014-09-13 ENCOUNTER — Other Ambulatory Visit: Payer: Self-pay | Admitting: Cardiovascular Disease

## 2014-09-13 NOTE — Telephone Encounter (Signed)
REFILL 

## 2014-10-17 ENCOUNTER — Encounter: Payer: Self-pay | Admitting: Cardiovascular Disease

## 2014-10-17 ENCOUNTER — Ambulatory Visit (INDEPENDENT_AMBULATORY_CARE_PROVIDER_SITE_OTHER): Payer: BLUE CROSS/BLUE SHIELD | Admitting: Cardiovascular Disease

## 2014-10-17 VITALS — BP 132/84 | HR 76 | Ht 62.0 in | Wt 141.0 lb

## 2014-10-17 DIAGNOSIS — E785 Hyperlipidemia, unspecified: Secondary | ICD-10-CM | POA: Diagnosis not present

## 2014-10-17 DIAGNOSIS — I10 Essential (primary) hypertension: Secondary | ICD-10-CM

## 2014-10-17 NOTE — Assessment & Plan Note (Signed)
History of hypertension blood pressure measured at 132/84. She is on lisinopril 5 mg a day to continue current meds at current dosing

## 2014-10-17 NOTE — Assessment & Plan Note (Signed)
History of hyperlipidemia followed by her PCP 

## 2014-10-17 NOTE — Progress Notes (Signed)
10/17/2014 Cassandra Jacobson   1960/04/02  [id number]  Primary Physician Haze Rushing, MD Primary Cardiologist: Runell Gess MD Cassandra Jacobson   HPI:  Cassandra Jacobson is a delightful 54 year old mildly overweight married Caucasian female mother of 2 children he works as the Psychologist, sport and exercise at [employer] . She was referred by Dr. Herb Grays for cardiovascular evaluation because of positive cardiac risk factors and hypertension. She was seen here 6 years ago by Dr. Charlett Blake for an abnormal EKG consistent with mild left ventricular hypertrophy. A 2-D echo was performed at that time which was entirely normal specifically, there is no evidence of ventricular hypertrophy. . She is under a lot of stress at home with family related issues and has been following her blood pressure at home which has been moderately elevated.I began her on low-dose lisinopril which resulted in improvement in her blood pressure. She did have an urgent laparoscopic cholecystectomy back in November by Dr. Gerrit Friends and subsequent to that developed rectal prolapse and a rectal fissure. She is getting physical therapy for this.   Current Outpatient Prescriptions  Medication Sig Dispense Refill  . aspirin-acetaminophen-caffeine (EXCEDRIN MIGRAINE) 250-250-65 MG per tablet Take 1 tablet by mouth every 6 (six) hours as needed for headache or migraine.    . DULoxetine (CYMBALTA) 30 MG capsule Take 30 mg by mouth at bedtime.     . fluticasone (VERAMYST) 27.5 MCG/SPRAY nasal spray Place 2 sprays into the nose daily as needed for rhinitis.     Marland Kitchen ibuprofen (ADVIL,MOTRIN) 200 MG tablet Take 200-400 mg by mouth daily as needed for headache.    . lisinopril (PRINIVIL,ZESTRIL) 5 MG tablet TAKE 1 TABLET BY MOUTH EVERY DAY 30 tablet 1  . norgestimate-ethinyl estradiol (ORTHO-CYCLEN,SPRINTEC,PREVIFEM) 0.25-35 MG-MCG tablet Take 1 tablet by mouth at bedtime.     Marland Kitchen tetrahydrozoline (VISINE) 0.05 % ophthalmic solution Place  1 drop into both eyes every morning.    . polyethylene glycol powder (GLYCOLAX/MIRALAX) powder Take 1 g by mouth as needed.  12   No current facility-administered medications for this visit.    No Known Allergies  History   Social History  . Marital Status: Married    Spouse Name: N/A  . Number of Children: N/A  . Years of Education: N/A   Occupational History  . Not on file.   Social History Main Topics  . Smoking status: Never Smoker   . Smokeless tobacco: Never Used  . Alcohol Use: No  . Drug Use: No  . Sexual Activity: Not on file   Other Topics Concern  . Not on file   Social History Narrative     Review of Systems: General: negative for chills, fever, night sweats or weight changes.  Cardiovascular: negative for chest pain, dyspnea on exertion, edema, orthopnea, palpitations, paroxysmal nocturnal dyspnea or shortness of breath Dermatological: negative for rash Respiratory: negative for cough or wheezing Urologic: negative for hematuria Abdominal: negative for nausea, vomiting, diarrhea, bright red blood per rectum, melena, or hematemesis Neurologic: negative for visual changes, syncope, or dizziness All other systems reviewed and are otherwise negative except as noted above.    Blood pressure 132/84, pulse 76, height 5\' 2"  (1.575 m), weight 141 lb (63.957 kg).  General appearance: alert and no distress Neck: no adenopathy, no carotid bruit, no JVD, supple, symmetrical, trachea midline and thyroid not enlarged, symmetric, no tenderness/mass/nodules Lungs: clear to auscultation bilaterally Heart: regular rate and rhythm, S1, S2 normal, no murmur, click, rub or  gallop Extremities: extremities normal, atraumatic, no cyanosis or edema  EKG normal sinus rhythm at 76 without ST or T-wave changes. I personally reviewed this EKG  ASSESSMENT AND PLAN:   Hyperlipidemia History of hyperlipidemia followed by her PCP  Essential hypertension History of hypertension  blood pressure measured at 132/84. She is on lisinopril 5 mg a day to continue current meds at current dosing      Runell Gess MD [facility], [facility] 10/17/2014 4:31 PM

## 2014-10-17 NOTE — Patient Instructions (Signed)
Foloow up wit Dr.Berry as needed

## 2014-11-15 ENCOUNTER — Other Ambulatory Visit: Payer: Self-pay | Admitting: Cardiovascular Disease

## 2014-11-15 NOTE — Telephone Encounter (Signed)
Rx(s) sent to pharmacy electronically.  

## 2014-12-15 ENCOUNTER — Other Ambulatory Visit: Payer: Self-pay | Admitting: Gastroenterology

## 2014-12-15 DIAGNOSIS — K7689 Other specified diseases of liver: Secondary | ICD-10-CM

## 2014-12-15 DIAGNOSIS — K6289 Other specified diseases of anus and rectum: Secondary | ICD-10-CM

## 2014-12-21 ENCOUNTER — Other Ambulatory Visit: Payer: BLUE CROSS/BLUE SHIELD

## 2014-12-21 ENCOUNTER — Ambulatory Visit
Admission: RE | Admit: 2014-12-21 | Discharge: 2014-12-21 | Disposition: A | Discharge status: Home / Self Care | Payer: BLUE CROSS/BLUE SHIELD | Source: Ambulatory Visit | Attending: Gastroenterology | Admitting: Gastroenterology

## 2014-12-21 DIAGNOSIS — K6289 Other specified diseases of anus and rectum: Secondary | ICD-10-CM

## 2014-12-21 DIAGNOSIS — K7689 Other specified diseases of liver: Secondary | ICD-10-CM

## 2014-12-21 MED ORDER — IOPAMIDOL (ISOVUE-300) INJECTION 61%
100.0000 mL | Freq: Once | INTRAVENOUS | Status: AC | PRN
  Administered 2014-12-21: 100 mL via INTRAVENOUS

## 2015-11-15 ENCOUNTER — Other Ambulatory Visit: Payer: Self-pay | Admitting: Cardiovascular Disease

## 2015-11-22 ENCOUNTER — Telehealth: Payer: Self-pay | Admitting: Cardiovascular Disease

## 2015-11-22 MED ORDER — AMLODIPINE BESYLATE 2.5 MG PO TABS: 2.5000 mg | ORAL_TABLET | Freq: Every day | ORAL | 2 refills | Status: DC

## 2015-11-22 NOTE — Telephone Encounter (Signed)
Point of clarification -- patient reported she was not having any throat swelling, shortness of breath, rash, tingling, etc - she relayed that her symptoms were limited to lip swelling only.

## 2015-11-22 NOTE — Telephone Encounter (Signed)
Returned call to patient. Notes since Saturday, she has had lip swelling (top and bottom) and chapping. States she reviewed w her local pharmacist - they were concerned about potential for lisinopril to cause this. She also notes she had this reaction approx 2 months ago and it self resolved after approx 1 week.  She has been on lisinopril for about 2.5 years, by her recollection. 2 months ago was the 1st time she had noted any SEs that might be med related.  She denies any recent changes to diet, new hair/makeup products, changes to facial cleansers, etc. She is currently only taking 3 meds daily - lisinopril, cymbalta, and sprintec.  I recommended she discontinue the lisinopril and await further advice, which will likely include recommendation for alt BP med. Will route to clinical pharm for review & recommendations. Med list updated.

## 2015-11-22 NOTE — Telephone Encounter (Signed)
Patient advised on med change recommendations. I have advised her on timeline of SE improvement and to call if still concerns. I have informed her of potential common SE's of new medication.  We will try out a 30 day supply and how she does.  Pt aware to update us with any new problems and to call if/when she needs refill on this med.

## 2015-11-22 NOTE — Telephone Encounter (Signed)
Please call, pt thinks sje is having a reaction to the Lisinopril.Lip swollen for 3 days,Please call asap to advise.

## 2015-11-22 NOTE — Telephone Encounter (Signed)
Agree to stop lisinopril. She was controlled on a low dose so she can start with a low dose of amlodipine 2.5mg  daily. If she is able have her check her blood pressure and call if elevated.

## 2015-11-25 IMAGING — CT CT ABD-PELV W/ CM
3 of 5 series · 13 of 36 positions shown, 19 images · IV contrast (READICAT/WATER & [ID] ISOVUE 300)
Comparison: None

CLINICAL DATA: Liver cyst, rectal and anal pain with bleeding for
11 months, constipation, hypertension, irritable bowel syndrome,
hyperlipidemia, history kidney stones

EXAM:
CT ABDOMEN AND PELVIS WITH CONTRAST
TECHNIQUE: Multidetector CT imaging of the abdomen and pelvis was performed
using the standard protocol following bolus administration of
intravenous contrast. Sagittal and coronal MPR images reconstructed
from axial data set.
CONTRAST:  100mL B73SKC-PPP IOPAMIDOL (B73SKC-PPP) INJECTION 61% IV.
Dilute oral contrast.

[Series 3: abd/pelvis with · axial · 0.70mm/px · z∈[-351,-56]mm · 7 of 78 slices shown, 12 images]
[im 10/78  soft-tissue]
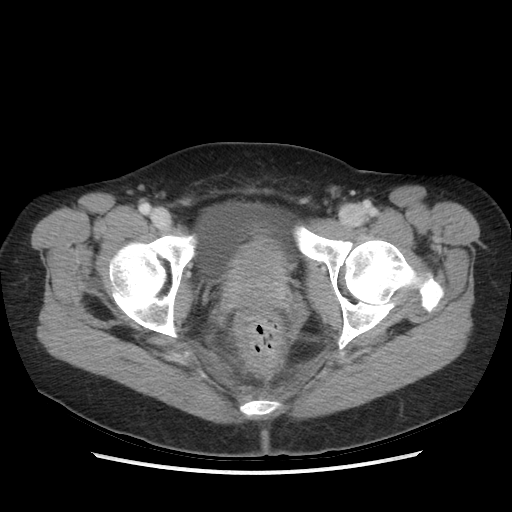
[im 10/78  bone]
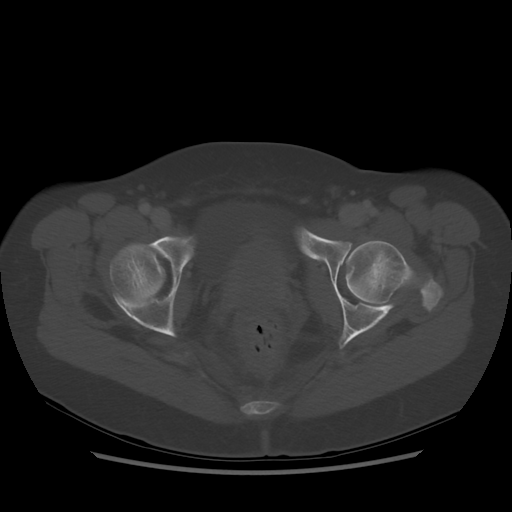
[im 20/78  soft-tissue]
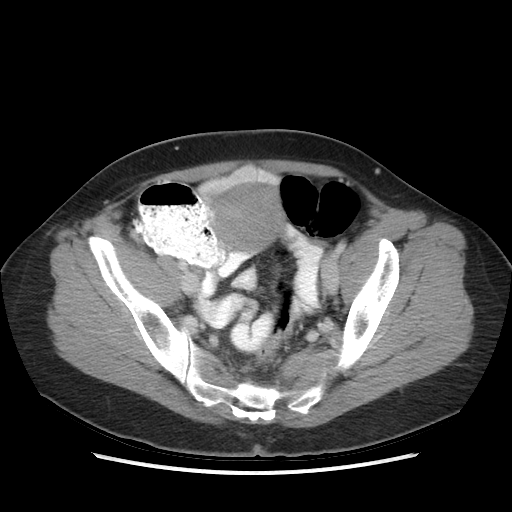
[im 29/78  soft-tissue]
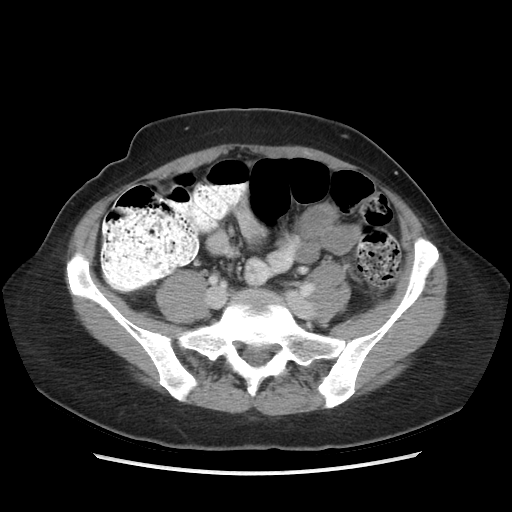
[im 39/78  soft-tissue]
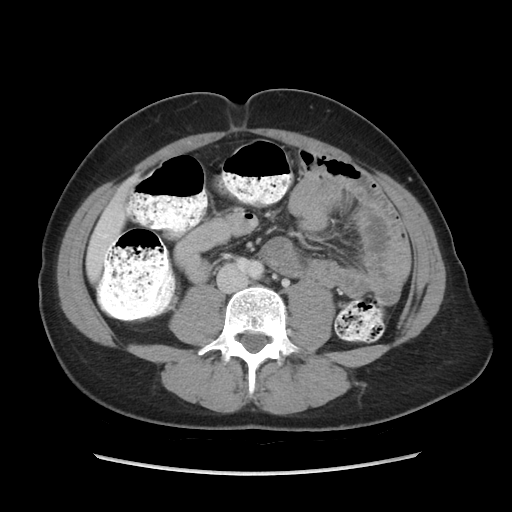
[im 39/78  lung]
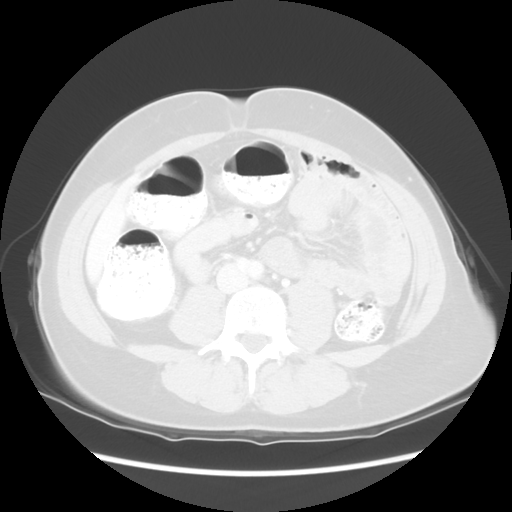
[im 49/78  soft-tissue]
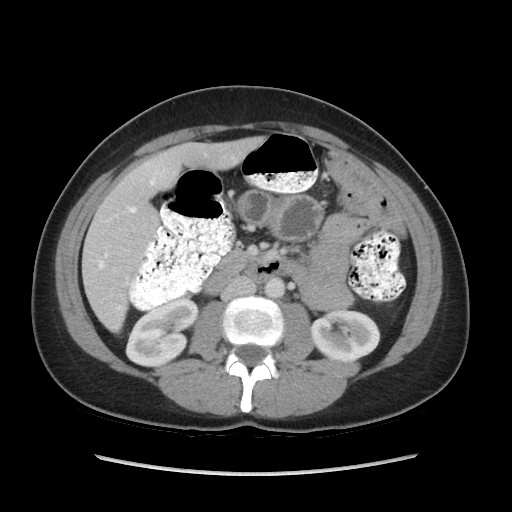
[im 49/78  lung]
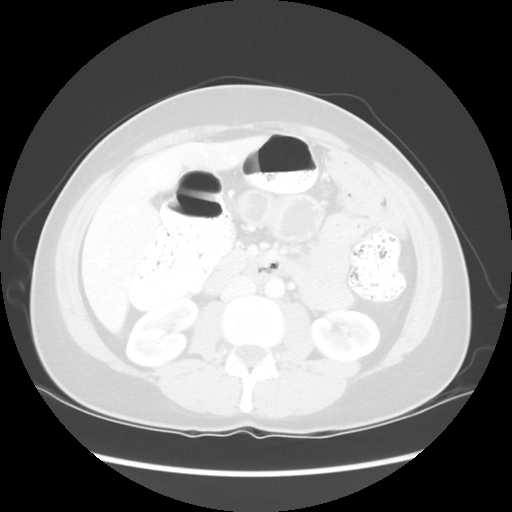
[im 58/78  soft-tissue]
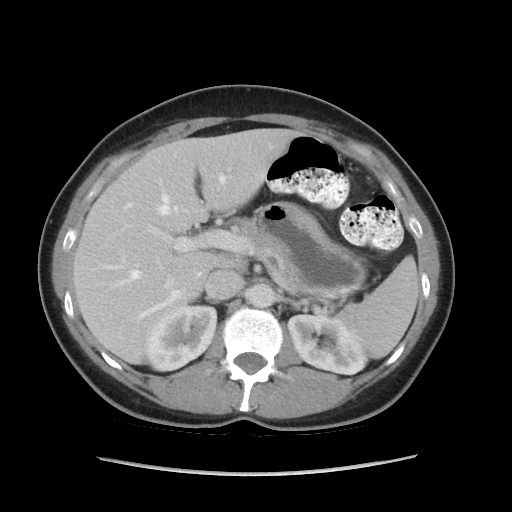
[im 58/78  lung]
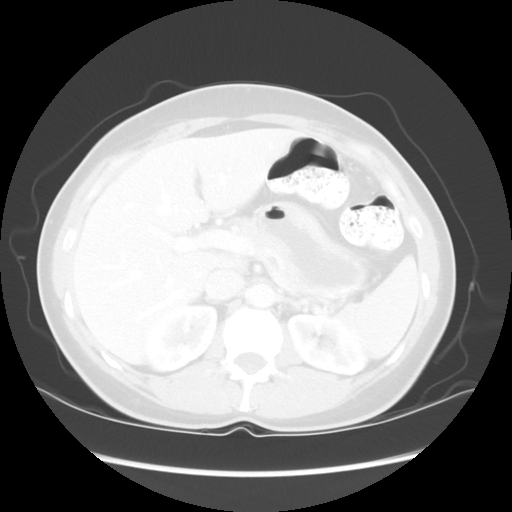
[im 68/78  soft-tissue]
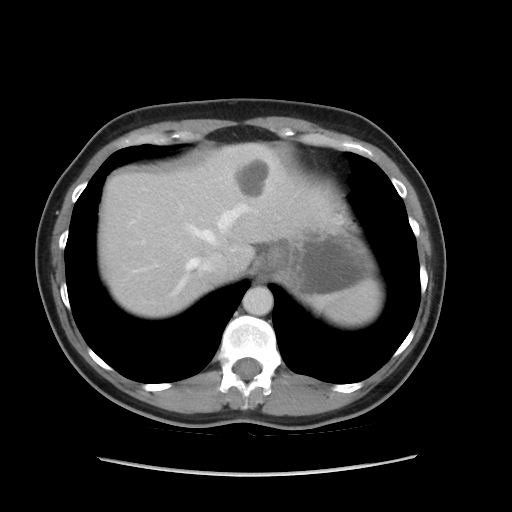
[im 68/78  lung]
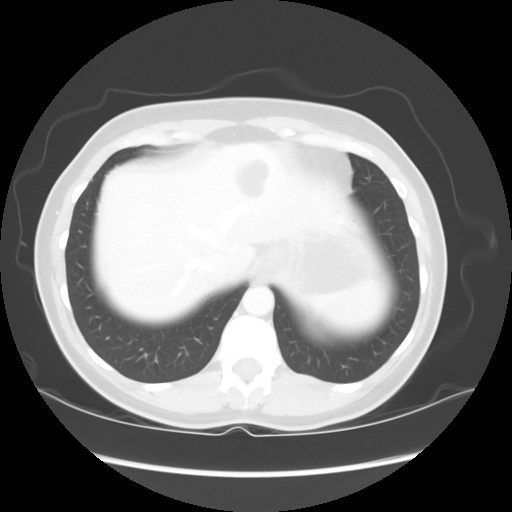

[Series 601: coronal body · coronal · 0.80mm/px · 1 of 113 slices shown, 2 images]
[im 38/113  soft-tissue]
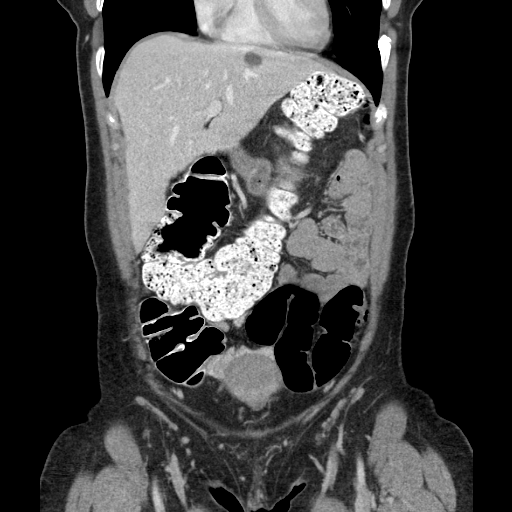
[im 38/113  bone]
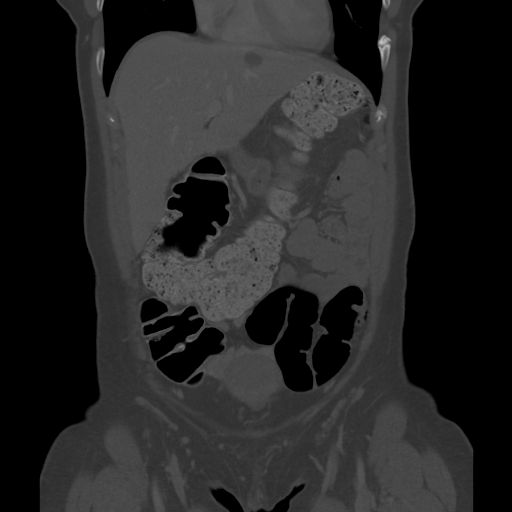

[Series 602: sagittal body · sagittal · 0.80mm/px · 5 of 145 slices shown]
[im 10/145  soft-tissue]
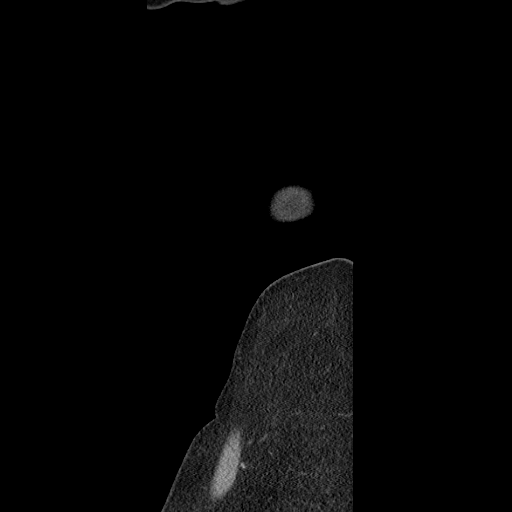
[im 29/145  soft-tissue]
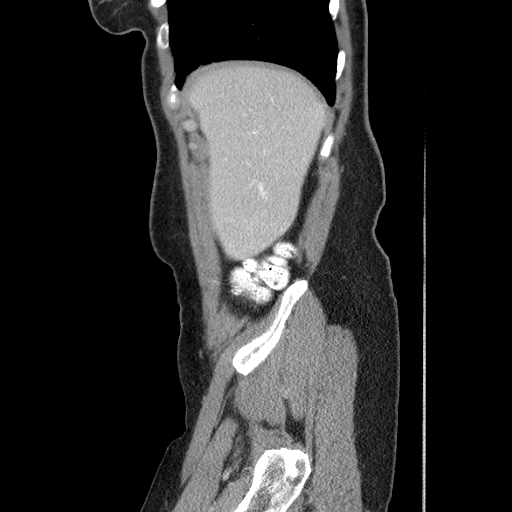
[im 49/145  soft-tissue]
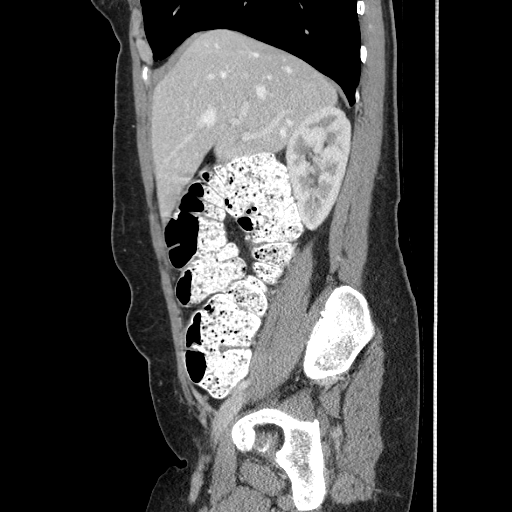
[im 68/145  soft-tissue]
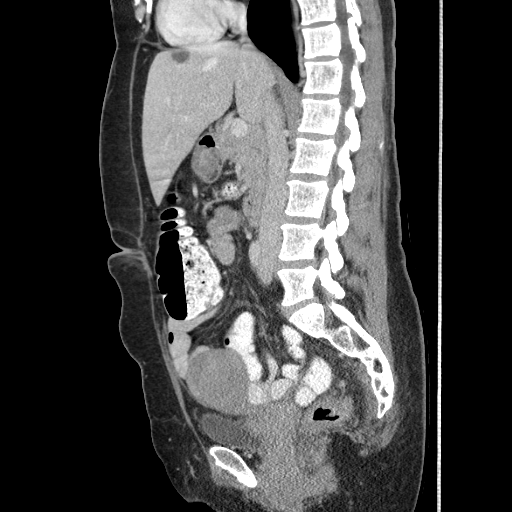
[im 77/145  soft-tissue]
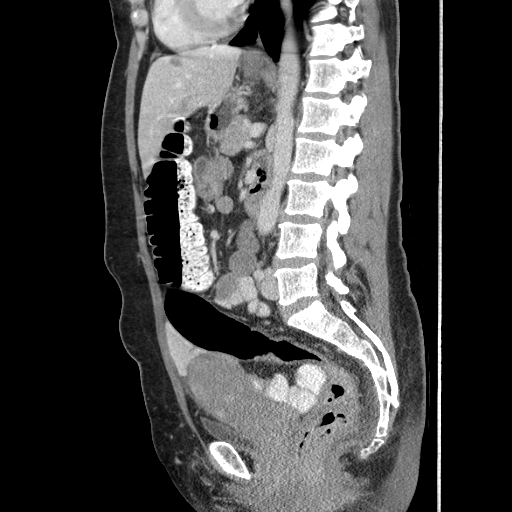

[13 of 36 positions shown; findings below may reference images not displayed]

FINDINGS: Lung bases clear.

Cyst at lateral segment LEFT lobe liver superiorly 2.7 x 2.3 cm
image 10.

Gallbladder surgically absent.

Liver, spleen, pancreas, kidneys, and adrenal glands normal.

Normal appendix.

Normal appearing bladder, ureters, uterus, and adnexa.

Rectal wall thickening without surrounding fluid collection/abscess.

Tiny LEFT perirectal nodule/node 6 mm diameter image 66.

Stomach and remaining bowel loops normal appearance.

No perianal inflammatory process or abscess seen.

No mass, adenopathy, free air, free fluid, or inflammatory process.

Osseous structures unremarkable.
IMPRESSION: Rectal wall thickening compatible with colitis/proctitis, could be
due to inflammatory bowel disease or infection.

No definite evidence of perirectal or perianal abscess.

Single tiny 6 mm perirectal nodule/node.

Hepatic cyst.

## 2015-12-17 ENCOUNTER — Other Ambulatory Visit: Payer: Self-pay | Admitting: Cardiovascular Disease

## 2015-12-17 NOTE — Telephone Encounter (Signed)
Rx request sent to pharmacy.  

## 2016-01-25 ENCOUNTER — Other Ambulatory Visit: Payer: Self-pay | Admitting: Gastroenterology

## 2016-01-25 DIAGNOSIS — K7689 Other specified diseases of liver: Secondary | ICD-10-CM

## 2016-02-27 ENCOUNTER — Other Ambulatory Visit: Payer: Self-pay | Admitting: Cardiovascular Disease

## 2016-09-07 ENCOUNTER — Other Ambulatory Visit: Payer: Self-pay | Admitting: Cardiovascular Disease

## 2016-09-08 NOTE — Telephone Encounter (Signed)
Amlodipine refill refused - defer to PCP - per 2016 note, f/up with Dr. Allyson SabalBerry PRN

## 2016-10-15 ENCOUNTER — Telehealth: Payer: Self-pay | Admitting: Cardiovascular Disease

## 2016-10-15 NOTE — Telephone Encounter (Signed)
Not seen since 2016, per note follow up PRN pt will need to get refills with PCP-[facility]

## 2016-10-15 NOTE — Telephone Encounter (Signed)
New message   Patient calling- due to insurance   *STAT* If patient is at the pharmacy, call can be transferred to refill team.   1. Which medications need to be refilled? (please list name of each medication and dose if known) amlodipine (NORVASC) 2.5 MG tablet  2. Which pharmacy/location (including street and city if local pharmacy) is medication to be sent to? [facility] [place]- [phone]   3. Do they need a 30 day or 90 day supply? 90 days supply

## 2016-10-16 ENCOUNTER — Other Ambulatory Visit: Payer: Self-pay | Admitting: Cardiovascular Disease

## 2020-10-16 ENCOUNTER — Telehealth: Payer: Self-pay

## 2020-10-17 NOTE — Telephone Encounter (Signed)
Error
